# Patient Record
Sex: Male | Born: 1980 | Race: Black or African American | Hispanic: No | Marital: Married | Smoking: Never smoker
Health system: Southern US, Community
[De-identification: ages and names within clinical notes are randomized; demographics above are authoritative.]

## PROBLEM LIST (undated history)

## (undated) DIAGNOSIS — W3400XA Accidental discharge from unspecified firearms or gun, initial encounter: Secondary | ICD-10-CM

## (undated) DIAGNOSIS — K219 Gastro-esophageal reflux disease without esophagitis: Secondary | ICD-10-CM

## (undated) DIAGNOSIS — I10 Essential (primary) hypertension: Secondary | ICD-10-CM

## (undated) HISTORY — PX: LEG SURGERY: SHX1003

---

## 2004-07-08 DIAGNOSIS — W3400XA Accidental discharge from unspecified firearms or gun, initial encounter: Secondary | ICD-10-CM

## 2004-07-08 HISTORY — DX: Accidental discharge from unspecified firearms or gun, initial encounter: W34.00XA

## 2004-10-07 ENCOUNTER — Inpatient Hospital Stay (HOSPITAL_COMMUNITY): Admission: AC | Admit: 2004-10-07 | Discharge: 2004-10-09 | Payer: Self-pay

## 2011-05-19 ENCOUNTER — Encounter (HOSPITAL_COMMUNITY): Payer: Self-pay | Admitting: Anesthesiology

## 2011-05-19 ENCOUNTER — Emergency Department (HOSPITAL_COMMUNITY)

## 2011-05-19 ENCOUNTER — Emergency Department (HOSPITAL_COMMUNITY): Admitting: Anesthesiology

## 2011-05-19 ENCOUNTER — Encounter: Payer: Self-pay | Admitting: *Deleted

## 2011-05-19 ENCOUNTER — Other Ambulatory Visit: Payer: Self-pay

## 2011-05-19 ENCOUNTER — Encounter (HOSPITAL_COMMUNITY): Admission: EM | Disposition: A | Discharge status: Home / Self Care | Payer: Self-pay | Source: Home / Self Care

## 2011-05-19 ENCOUNTER — Inpatient Hospital Stay (HOSPITAL_COMMUNITY)
Admission: EM | Admit: 2011-05-19 | Discharge: 2011-05-22 | DRG: 493 | Disposition: A | Discharge status: Home / Self Care | Attending: General Surgery | Admitting: General Surgery

## 2011-05-19 DIAGNOSIS — F17200 Nicotine dependence, unspecified, uncomplicated: Secondary | ICD-10-CM | POA: Diagnosis present

## 2011-05-19 DIAGNOSIS — W3400XA Accidental discharge from unspecified firearms or gun, initial encounter: Secondary | ICD-10-CM

## 2011-05-19 DIAGNOSIS — S82209B Unspecified fracture of shaft of unspecified tibia, initial encounter for open fracture type I or II: Secondary | ICD-10-CM

## 2011-05-19 DIAGNOSIS — S82409B Unspecified fracture of shaft of unspecified fibula, initial encounter for open fracture type I or II: Secondary | ICD-10-CM

## 2011-05-19 DIAGNOSIS — S63509A Unspecified sprain of unspecified wrist, initial encounter: Secondary | ICD-10-CM | POA: Diagnosis present

## 2011-05-19 DIAGNOSIS — S32409A Unspecified fracture of unspecified acetabulum, initial encounter for closed fracture: Secondary | ICD-10-CM

## 2011-05-19 DIAGNOSIS — S63502A Unspecified sprain of left wrist, initial encounter: Secondary | ICD-10-CM | POA: Diagnosis present

## 2011-05-19 DIAGNOSIS — S82201B Unspecified fracture of shaft of right tibia, initial encounter for open fracture type I or II: Secondary | ICD-10-CM

## 2011-05-19 DIAGNOSIS — D62 Acute posthemorrhagic anemia: Secondary | ICD-10-CM | POA: Diagnosis present

## 2011-05-19 DIAGNOSIS — S82209A Unspecified fracture of shaft of unspecified tibia, initial encounter for closed fracture: Secondary | ICD-10-CM | POA: Diagnosis present

## 2011-05-19 HISTORY — PX: TIBIA IM NAIL INSERTION: SHX2516

## 2011-05-19 HISTORY — DX: Accidental discharge from unspecified firearms or gun, initial encounter: W34.00XA

## 2011-05-19 LAB — URINALYSIS, MICROSCOPIC ONLY
Hgb urine dipstick: NEGATIVE
Ketones, ur: NEGATIVE mg/dL
Leukocytes, UA: NEGATIVE
Specific Gravity, Urine: >1.046 — ABNORMAL HIGH (ref 1.005–1.030)
pH: 6.5 (ref 5.0–8.0)

## 2011-05-19 LAB — ABO/RH: ABO/RH(D): O POS

## 2011-05-19 LAB — COMPREHENSIVE METABOLIC PANEL
ALT: 13 U/L (ref 0–53)
AST: 23 U/L (ref 0–37)
Albumin: 3.7 g/dL (ref 3.5–5.2)
CO2: 24 mEq/L (ref 19–32)
Chloride: 105 mEq/L (ref 96–112)
Creatinine, Ser: 0.9 mg/dL (ref 0.50–1.35)
GFR calc non Af Amer: >90 mL/min (ref >90)
Potassium: 3.7 mEq/L (ref 3.5–5.1)
Sodium: 141 mEq/L (ref 135–145)
Total Bilirubin: 0.3 mg/dL (ref 0.3–1.2)

## 2011-05-19 LAB — CBC
MCH: 30.3 pg (ref 26.0–34.0)
MCV: 88.5 fL (ref 78.0–100.0)
RBC: 4.62 MIL/uL (ref 4.22–5.81)
RDW: 13.2 % (ref 11.5–15.5)
WBC: 22.3 10*3/uL — ABNORMAL HIGH (ref 4.0–10.5)

## 2011-05-19 LAB — TYPE AND SCREEN
ABO/RH(D): O POS
Antibody Screen: NEGATIVE

## 2011-05-19 LAB — SAMPLE TO BLOOD BANK

## 2011-05-19 LAB — PROTIME-INR: INR: 0.98 (ref 0.00–1.49)

## 2011-05-19 SURGERY — INSERTION, INTRAMEDULLARY ROD, TIBIA
Anesthesia: General | Site: Leg Lower | Laterality: Right | Wound class: Contaminated

## 2011-05-19 MED ORDER — FENTANYL CITRATE 0.05 MG/ML IJ SOLN
50.0000 ug | Freq: Once | INTRAMUSCULAR | Status: AC
  Administered 2011-05-19: 50 ug via INTRAVENOUS

## 2011-05-19 MED ORDER — METOCLOPRAMIDE HCL 10 MG PO TABS: 5.0000 mg | ORAL_TABLET | Freq: Three times a day (TID) | ORAL | Status: DC | PRN

## 2011-05-19 MED ORDER — METHOCARBAMOL 500 MG PO TABS
500.0000 mg | ORAL_TABLET | Freq: Four times a day (QID) | ORAL | Status: DC | PRN
  Administered 2011-05-19 – 2011-05-21 (×4): 500 mg via ORAL
  Filled 2011-05-19 (×4): qty 1

## 2011-05-19 MED ORDER — PANTOPRAZOLE SODIUM 40 MG IV SOLR
40.0000 mg | Freq: Every day | INTRAVENOUS | Status: DC
  Filled 2011-05-19 (×3): qty 40

## 2011-05-19 MED ORDER — PROMETHAZINE HCL 25 MG/ML IJ SOLN: 6.2500 mg | INTRAMUSCULAR | Status: DC | PRN

## 2011-05-19 MED ORDER — VECURONIUM BROMIDE 10 MG IV SOLR
INTRAVENOUS | Status: DC | PRN
  Administered 2011-05-19: 3 mg via INTRAVENOUS

## 2011-05-19 MED ORDER — HETASTARCH-ELECTROLYTES 6 % IV SOLN
INTRAVENOUS | Status: DC | PRN
  Administered 2011-05-19: 12:00:00 via INTRAVENOUS

## 2011-05-19 MED ORDER — SENNOSIDES-DOCUSATE SODIUM 8.6-50 MG PO TABS: 1.0000 | ORAL_TABLET | Freq: Every evening | ORAL | Status: DC | PRN

## 2011-05-19 MED ORDER — POTASSIUM CHLORIDE 2 MEQ/ML IV SOLN
INTRAVENOUS | Status: DC
  Administered 2011-05-19 – 2011-05-20 (×3): via INTRAVENOUS
  Filled 2011-05-19 (×3): qty 1000

## 2011-05-19 MED ORDER — POTASSIUM CHLORIDE IN NACL 40-0.9 MEQ/L-% IV SOLN
INTRAVENOUS | Status: DC
  Administered 2011-05-20: 06:00:00 via INTRAVENOUS
  Filled 2011-05-19: qty 1000

## 2011-05-19 MED ORDER — LACTATED RINGERS IV SOLN
INTRAVENOUS | Status: DC | PRN
  Administered 2011-05-19 (×3): via INTRAVENOUS

## 2011-05-19 MED ORDER — HYDROMORPHONE HCL PF 1 MG/ML IJ SOLN: 1.0000 mg | INTRAMUSCULAR | Status: DC | PRN

## 2011-05-19 MED ORDER — HYDROMORPHONE HCL PF 1 MG/ML IJ SOLN
INTRAMUSCULAR | Status: AC
  Filled 2011-05-19: qty 1

## 2011-05-19 MED ORDER — HYDROMORPHONE HCL PF 1 MG/ML IJ SOLN: 0.2500 mg | INTRAMUSCULAR | Status: DC | PRN

## 2011-05-19 MED ORDER — ROCURONIUM BROMIDE 100 MG/10ML IV SOLN
INTRAVENOUS | Status: DC | PRN
  Administered 2011-05-19: 30 mg via INTRAVENOUS
  Administered 2011-05-19: 20 mg via INTRAVENOUS

## 2011-05-19 MED ORDER — DOCUSATE SODIUM 100 MG PO CAPS
100.0000 mg | ORAL_CAPSULE | Freq: Two times a day (BID) | ORAL | Status: DC
  Administered 2011-05-19 – 2011-05-22 (×6): 100 mg via ORAL
  Filled 2011-05-19 (×9): qty 1

## 2011-05-19 MED ORDER — MIDAZOLAM HCL 2 MG/2ML IJ SOLN: 0.5000 mg | Freq: Once | INTRAMUSCULAR | Status: DC | PRN

## 2011-05-19 MED ORDER — FENTANYL CITRATE 0.05 MG/ML IJ SOLN
INTRAMUSCULAR | Status: AC
  Administered 2011-05-19: 25 ug
  Filled 2011-05-19: qty 2

## 2011-05-19 MED ORDER — MORPHINE SULFATE 2 MG/ML IJ SOLN: 2.0000 mg | INTRAMUSCULAR | Status: DC | PRN

## 2011-05-19 MED ORDER — BISACODYL 10 MG RE SUPP: 10.0000 mg | Freq: Every day | RECTAL | Status: DC | PRN

## 2011-05-19 MED ORDER — FENTANYL CITRATE 0.05 MG/ML IJ SOLN
INTRAMUSCULAR | Status: DC | PRN
  Administered 2011-05-19: 50 ug via INTRAVENOUS
  Administered 2011-05-19: 150 ug via INTRAVENOUS
  Administered 2011-05-19: 100 ug via INTRAVENOUS

## 2011-05-19 MED ORDER — PROPOFOL 10 MG/ML IV EMUL
INTRAVENOUS | Status: DC | PRN
  Administered 2011-05-19: 170 mg via INTRAVENOUS

## 2011-05-19 MED ORDER — VANCOMYCIN HCL IN DEXTROSE 1-5 GM/200ML-% IV SOLN
1000.0000 mg | Freq: Two times a day (BID) | INTRAVENOUS | Status: AC
  Administered 2011-05-19 – 2011-05-20 (×3): 1000 mg via INTRAVENOUS
  Filled 2011-05-19 (×3): qty 200

## 2011-05-19 MED ORDER — ONDANSETRON HCL 4 MG PO TABS: 4.0000 mg | ORAL_TABLET | Freq: Four times a day (QID) | ORAL | Status: DC | PRN

## 2011-05-19 MED ORDER — LACTATED RINGERS IV SOLN: INTRAVENOUS | Status: DC

## 2011-05-19 MED ORDER — SODIUM CHLORIDE 0.9 % IR SOLN
Status: DC | PRN
  Administered 2011-05-19: 1000 mL

## 2011-05-19 MED ORDER — MEPERIDINE HCL 25 MG/ML IJ SOLN: 6.2500 mg | INTRAMUSCULAR | Status: DC | PRN

## 2011-05-19 MED ORDER — PHENYLEPHRINE HCL 10 MG/ML IJ SOLN
INTRAMUSCULAR | Status: DC | PRN
  Administered 2011-05-19: 120 ug via INTRAVENOUS

## 2011-05-19 MED ORDER — MORPHINE SULFATE 2 MG/ML IJ SOLN: 0.0500 mg/kg | INTRAMUSCULAR | Status: DC | PRN

## 2011-05-19 MED ORDER — METOCLOPRAMIDE HCL 5 MG/ML IJ SOLN
5.0000 mg | Freq: Three times a day (TID) | INTRAMUSCULAR | Status: DC | PRN
  Filled 2011-05-19: qty 2

## 2011-05-19 MED ORDER — ONDANSETRON HCL 4 MG/2ML IJ SOLN
INTRAMUSCULAR | Status: DC | PRN
  Administered 2011-05-19: 4 mg via INTRAVENOUS

## 2011-05-19 MED ORDER — WARFARIN SODIUM 7.5 MG PO TABS
7.5000 mg | ORAL_TABLET | Freq: Once | ORAL | Status: AC
  Administered 2011-05-19: 7.5 mg via ORAL
  Filled 2011-05-19: qty 1

## 2011-05-19 MED ORDER — IOHEXOL 350 MG/ML SOLN: 100.0000 mL | Freq: Once | INTRAVENOUS | Status: AC | PRN

## 2011-05-19 MED ORDER — NEOSTIGMINE METHYLSULFATE 1 MG/ML IJ SOLN
INTRAMUSCULAR | Status: DC | PRN
  Administered 2011-05-19: 5 mg via INTRAVENOUS

## 2011-05-19 MED ORDER — OXYCODONE-ACETAMINOPHEN 5-325 MG PO TABS
1.0000 | ORAL_TABLET | ORAL | Status: DC | PRN
Start: 2011-05-19 — End: 2011-05-21
  Administered 2011-05-19 – 2011-05-21 (×7): 2 via ORAL
  Filled 2011-05-19 (×7): qty 2

## 2011-05-19 MED ORDER — ALBUMIN HUMAN 5 % IV SOLN
INTRAVENOUS | Status: DC | PRN
  Administered 2011-05-19: 11:00:00 via INTRAVENOUS

## 2011-05-19 MED ORDER — COUMADIN BOOK
Freq: Once | Status: AC
  Administered 2011-05-19: 20:00:00
  Filled 2011-05-19: qty 1

## 2011-05-19 MED ORDER — HYDROMORPHONE HCL PF 1 MG/ML IJ SOLN: 0.5000 mg | INTRAMUSCULAR | Status: DC | PRN

## 2011-05-19 MED ORDER — METHOCARBAMOL 100 MG/ML IJ SOLN
500.0000 mg | Freq: Four times a day (QID) | INTRAMUSCULAR | Status: DC | PRN
  Filled 2011-05-19: qty 5

## 2011-05-19 MED ORDER — ONDANSETRON HCL 4 MG/2ML IJ SOLN: 4.0000 mg | Freq: Four times a day (QID) | INTRAMUSCULAR | Status: DC | PRN

## 2011-05-19 MED ORDER — POTASSIUM CHLORIDE IN NACL 20-0.45 MEQ/L-% IV SOLN: INTRAVENOUS | Status: DC

## 2011-05-19 MED ORDER — GLYCOPYRROLATE 0.2 MG/ML IJ SOLN
INTRAMUSCULAR | Status: DC | PRN
  Administered 2011-05-19: .8 mg via INTRAVENOUS

## 2011-05-19 MED ORDER — MORPHINE SULFATE 4 MG/ML IJ SOLN
1.0000 mg | INTRAMUSCULAR | Status: DC | PRN
  Administered 2011-05-19 – 2011-05-21 (×7): 2 mg via INTRAVENOUS
  Filled 2011-05-19 (×7): qty 1

## 2011-05-19 MED ORDER — PANTOPRAZOLE SODIUM 40 MG PO TBEC
40.0000 mg | DELAYED_RELEASE_TABLET | Freq: Every day | ORAL | Status: DC
  Administered 2011-05-19 – 2011-05-20 (×2): 40 mg via ORAL
  Filled 2011-05-19 (×2): qty 1

## 2011-05-19 MED ORDER — WARFARIN VIDEO
Freq: Once | Status: AC
  Administered 2011-05-20: 20:00:00
  Filled 2011-05-19: qty 1

## 2011-05-19 MED ORDER — MIDAZOLAM HCL 5 MG/5ML IJ SOLN
INTRAMUSCULAR | Status: DC | PRN
  Administered 2011-05-19: 2 mg via INTRAVENOUS

## 2011-05-19 MED ORDER — VANCOMYCIN HCL IN DEXTROSE 1-5 GM/200ML-% IV SOLN
1000.0000 mg | Freq: Once | INTRAVENOUS | Status: AC
  Administered 2011-05-19: 1000 mg via INTRAVENOUS
  Filled 2011-05-19: qty 200

## 2011-05-19 MED ORDER — SUCCINYLCHOLINE CHLORIDE 20 MG/ML IJ SOLN
INTRAMUSCULAR | Status: DC | PRN
  Administered 2011-05-19: 100 mg via INTRAVENOUS

## 2011-05-19 SURGICAL SUPPLY — 66 items
10mm x 40cm trigen meta nail tibial ×1 IMPLANT
BANDAGE ELASTIC 4 VELCRO ST LF (GAUZE/BANDAGES/DRESSINGS) ×2 IMPLANT
BANDAGE ELASTIC 6 VELCRO ST LF (GAUZE/BANDAGES/DRESSINGS) ×2 IMPLANT
BANDAGE ESMARK 6X9 LF (GAUZE/BANDAGES/DRESSINGS) IMPLANT
BANDAGE GAUZE ELAST BULKY 4 IN (GAUZE/BANDAGES/DRESSINGS) ×2 IMPLANT
BIT DRILL LONG 4.0 (BIT) IMPLANT
BIT DRILL SHORT 4.0 (BIT) IMPLANT
BLADE SURG 15 STRL LF DISP TIS (BLADE) ×1 IMPLANT
BLADE SURG 15 STRL SS (BLADE)
BLADE SURG ROTATE 9660 (MISCELLANEOUS) ×1 IMPLANT
BNDG CMPR 9X6 STRL LF SNTH (GAUZE/BANDAGES/DRESSINGS)
BNDG COHESIVE 6X5 TAN STRL LF (GAUZE/BANDAGES/DRESSINGS) ×2 IMPLANT
BNDG ESMARK 6X9 LF (GAUZE/BANDAGES/DRESSINGS)
CLOTH BEACON ORANGE TIMEOUT ST (SAFETY) ×2 IMPLANT
COVER SURGICAL LIGHT HANDLE (MISCELLANEOUS) ×4 IMPLANT
CUFF TOURNIQUET SINGLE 34IN LL (TOURNIQUET CUFF) IMPLANT
CUFF TOURNIQUET SINGLE 44IN (TOURNIQUET CUFF) IMPLANT
DRAPE C-ARM 42X72 X-RAY (DRAPES) ×2 IMPLANT
DRAPE ORTHO SPLIT 77X108 STRL (DRAPES) ×4
DRAPE PROXIMA HALF (DRAPES) ×7 IMPLANT
DRAPE SURG ORHT 6 SPLT 77X108 (DRAPES) ×2 IMPLANT
DRAPE U-SHAPE 47X51 STRL (DRAPES) ×2 IMPLANT
DRILL BIT LONG 4.0 (BIT) ×2
DRILL BIT SHORT 4.0 (BIT) ×2
DRSG VAC ATS SM SENSATRAC (GAUZE/BANDAGES/DRESSINGS) ×1 IMPLANT
DURAPREP 26ML APPLICATOR (WOUND CARE) ×3 IMPLANT
ELECT REM PT RETURN 9FT ADLT (ELECTROSURGICAL) ×2
ELECTRODE REM PT RTRN 9FT ADLT (ELECTROSURGICAL) ×1 IMPLANT
FACESHIELD LNG OPTICON STERILE (SAFETY) IMPLANT
GAUZE XEROFORM 5X9 LF (GAUZE/BANDAGES/DRESSINGS) ×2 IMPLANT
GLOVE BIOGEL PI IND STRL 8 (GLOVE) ×1 IMPLANT
GLOVE BIOGEL PI INDICATOR 8 (GLOVE) ×1
GLOVE SURG ORTHO 8.0 STRL STRW (GLOVE) ×4 IMPLANT
GOWN PREVENTION PLUS LG XLONG (DISPOSABLE) IMPLANT
GOWN PREVENTION PLUS XLARGE (GOWN DISPOSABLE) ×2 IMPLANT
GOWN STRL NON-REIN LRG LVL3 (GOWN DISPOSABLE) ×4 IMPLANT
GUIDE PIN 3.2MM (MISCELLANEOUS) ×2
GUIDE PIN ORTH 343X3.2XBRAD (MISCELLANEOUS) IMPLANT
GUIDE ROD 3.0 (MISCELLANEOUS) ×2
KIT BASIN OR (CUSTOM PROCEDURE TRAY) ×2 IMPLANT
KIT INFUSE MEDIUM (Orthopedic Implant) ×1 IMPLANT
KIT ROOM TURNOVER OR (KITS) ×2 IMPLANT
MANIFOLD NEPTUNE II (INSTRUMENTS) ×2 IMPLANT
NS IRRIG 1000ML POUR BTL (IV SOLUTION) ×3 IMPLANT
PACK GENERAL/GYN (CUSTOM PROCEDURE TRAY) ×2 IMPLANT
PAD NEG PRESSURE SENSATRAC (MISCELLANEOUS) ×1 IMPLANT
PADDING WEBRIL 4 STERILE (GAUZE/BANDAGES/DRESSINGS) ×1 IMPLANT
ROD GUIDE 3.0 (MISCELLANEOUS) IMPLANT
SCREW TRIGEN LOW PROF 5.0X30 (Screw) ×1 IMPLANT
SCREW TRIGEN LOW PROF 5.0X35 (Screw) ×1 IMPLANT
SCREW TRIGEN LOW PROF 5.0X45 (Screw) ×1 IMPLANT
SCREW TRIGEN LOW PROF 5.0X50 (Screw) ×1 IMPLANT
SPLINT PLASTER 5X30 (CAST SUPPLIES) ×1 IMPLANT
SPONGE GAUZE 4X4 12PLY (GAUZE/BANDAGES/DRESSINGS) ×4 IMPLANT
STAPLER VISISTAT 35W (STAPLE) ×2 IMPLANT
STOCKINETTE IMPERVIOUS LG (DRAPES) ×2 IMPLANT
SUT ETHILON 2 0 FS 18 (SUTURE) ×3 IMPLANT
SUT ETHILON 3 0 PS 1 (SUTURE) ×2 IMPLANT
SUT VIC AB 0 CT1 27 (SUTURE) ×2
SUT VIC AB 0 CT1 27XBRD ANBCTR (SUTURE) ×1 IMPLANT
SUT VIC AB 2-0 CT1 27 (SUTURE) ×2
SUT VIC AB 2-0 CT1 TAPERPNT 27 (SUTURE) ×1 IMPLANT
TOWEL OR 17X24 6PK STRL BLUE (TOWEL DISPOSABLE) ×2 IMPLANT
TOWEL OR 17X26 10 PK STRL BLUE (TOWEL DISPOSABLE) ×2 IMPLANT
TRAY FOLEY CATH 14FR (SET/KITS/TRAYS/PACK) ×1 IMPLANT
WATER STERILE IRR 1000ML POUR (IV SOLUTION) ×2 IMPLANT
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
## 2011-05-19 NOTE — Preoperative (Addendum)
Beta Blockers   Reason not to administer Beta Blockers:Not Applicable 

## 2011-05-19 NOTE — Anesthesia Procedure Notes (Signed)
Procedure Name: Intubation Date/Time: 05/19/2011 10:56 AM Performed by: Glendora Score Pre-anesthesia Checklist: Patient identified, Emergency Drugs available, Suction available and Patient being monitored Patient Re-evaluated:Patient Re-evaluated prior to inductionOxygen Delivery Method: Circle System Utilized Preoxygenation: Pre-oxygenation with 100% oxygen Intubation Type: IV induction, Rapid sequence and Circoid Pressure applied Laryngoscope Size: Miller and 2 Grade View: Grade I Tube type: Oral Tube size: 7.5 mm Number of attempts: 1 Airway Equipment and Method: stylet Placement Confirmation: ETT inserted through vocal cords under direct vision,  positive ETCO2 and breath sounds checked- equal and bilateral Secured at: 23 cm Tube secured with: Tape Dental Injury: Teeth and Oropharynx as per pre-operative assessment
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
## 2011-05-20 LAB — BASIC METABOLIC PANEL
Chloride: 103 mEq/L (ref 96–112)
GFR calc Af Amer: >90 mL/min (ref >90)
GFR calc non Af Amer: >90 mL/min (ref >90)
Potassium: 4 mEq/L (ref 3.5–5.1)
Sodium: 134 mEq/L — ABNORMAL LOW (ref 135–145)

## 2011-05-20 LAB — DIFFERENTIAL
Basophils Absolute: 0 10*3/uL (ref 0.0–0.1)
Basophils Relative: 0 % (ref 0–1)
Eosinophils Absolute: 0.1 10*3/uL (ref 0.0–0.7)
Monocytes Absolute: 1.4 10*3/uL — ABNORMAL HIGH (ref 0.1–1.0)
Neutro Abs: 7.1 10*3/uL (ref 1.7–7.7)
Neutrophils Relative %: 69 % (ref 43–77)

## 2011-05-20 LAB — CBC
Platelets: 194 10*3/uL (ref 150–400)
RDW: 13.1 % (ref 11.5–15.5)
WBC: 10.3 10*3/uL (ref 4.0–10.5)

## 2011-05-20 LAB — PROTIME-INR: Prothrombin Time: 15.1 seconds (ref 11.6–15.2)

## 2011-05-20 MED ORDER — WARFARIN SODIUM 10 MG PO TABS
10.0000 mg | ORAL_TABLET | Freq: Once | ORAL | Status: AC
  Administered 2011-05-20: 10 mg via ORAL
  Filled 2011-05-20: qty 1

## 2011-05-20 NOTE — Progress Notes (Signed)
ANTICOAGULATION CONSULT NOTE - Follow Up Consult  Pharmacy Consult for Coumadin for VTE prophylaxis Indication: VTE prophylaxis  Allergies  Allergen Reactions  . Penicillins Anaphylaxis    Patient Measurements:  Patient states that he weighs 170 pounds and is 6'1" tall.   TBW = 77 kg and IBW = 80 kg. Adjusted Body Weight: 80kg  Vital Signs: Temp: 99.4 F (37.4 C) (11/12 0701) Temp src: Oral (11/12 0701) BP: 119/59 mmHg (11/12 0701) Pulse Rate: 79  (11/12 0701)  Labs:  Basename 05/20/11 0550 05/19/11 0448  HGB 10.2* 14.0  HCT 30.1* 40.9  PLT 194 262  APTT -- --  LABPROT 15.1 13.2  INR 1.17 0.98  HEPARINUNFRC -- --  CREATININE 0.88 0.90  CKTOTAL -- --  CKMB -- --  TROPONINI -- --   CrCl is unknown because there is no height on file for the current visit.   Medications:  Scheduled:    . coumadin book   Does not apply Once  . docusate sodium  100 mg Oral BID  . pantoprazole  40 mg Oral Q1200   Or  . pantoprazole (PROTONIX) IV  40 mg Intravenous Q1200  . vancomycin  1,000 mg Intravenous Q12H  . warfarin  7.5 mg Oral Once  . warfarin   Does not apply Once    Assessment: Patient INR is 1.17 today after a coumadin dose of 7.5mg  yesterday.  Platelets and hct are dropping also.  Patient is on vancomycin and asking for stronger pain meds.  Goal of Therapy:  INR 2-3   Plan:  Coumadin 10mg  (based upon Nomogram score of 11) x 1 today at 1800.  Recheck INR in am and adjust dosage accordingly.   Walden Field B 05/20/2011,1:29 PM

## 2011-05-20 NOTE — Progress Notes (Signed)
Subjective: My leg hurts - pt on pain meds which are helping   Objective: Vital signs in last 24 hours: Temp:  [98.1 F (36.7 C)-99.4 F (37.4 C)] 99.4 F (37.4 C) (11/12 0701) Pulse Rate:  [68-117] 79  (11/12 0701) Resp:  [11-25] 20  (11/11 1618) BP: (119-161)/(59-112) 119/59 mmHg (11/12 0701) SpO2:  [100 %] 100 % (11/12 0701)  Intake/Output from previous day: 11/11 0701 - 11/12 0700 In: 3400 [I.V.:2650; IV Piggyback:750] Out: 200 [Urine:150; Blood:50] Intake/Output this shift: Total I/O In: 240 [P.O.:240] Out: 800 [Urine:800]  Exam:   toe pf ok on r - continued absent df - foot perfused - l hand mobility no different  Labs:  [facility] 05/20/11 0550 05/19/11 0448  HGB 10.2* 14.0    Basename 05/20/11 0550 05/19/11 0448  WBC 10.3 22.3*  RBC 3.37* 4.62  HCT 30.1* 40.9  PLT 194 262    Basename 05/20/11 0550 05/19/11 0448  NA 134* 141  K 4.0 3.7  CL 103 105  CO2 24 24  BUN 9 11  CREATININE 0.88 0.90  GLUCOSE 113* 146*  CALCIUM 8.5 8.9    Basename 05/20/11 0550 05/19/11 0448  LABPT -- --  INR 1.17 0.98    Assessment/Plan: Mobilize with pt - d/c wound vac wed - tdwb for transfers rle knee rom as tol -begin coumadin tonight for dvt prophylaxis compts soft today   Byrdie Miyazaki SCOTT 05/20/2011, 11:49 AM
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
## 2011-05-21 DIAGNOSIS — Z7289 Other problems related to lifestyle: Secondary | ICD-10-CM | POA: Insufficient documentation

## 2011-05-21 DIAGNOSIS — Z720 Tobacco use: Secondary | ICD-10-CM | POA: Insufficient documentation

## 2011-05-21 LAB — BASIC METABOLIC PANEL
BUN: 7 mg/dL (ref 6–23)
CO2: 27 mEq/L (ref 19–32)
Calcium: 8.9 mg/dL (ref 8.4–10.5)
Chloride: 98 mEq/L (ref 96–112)
Creatinine, Ser: 0.93 mg/dL (ref 0.50–1.35)

## 2011-05-21 LAB — PROTIME-INR: Prothrombin Time: 20.8 seconds — ABNORMAL HIGH (ref 11.6–15.2)

## 2011-05-21 MED ORDER — WARFARIN SODIUM 7.5 MG PO TABS
7.5000 mg | ORAL_TABLET | Freq: Once | ORAL | Status: AC
  Administered 2011-05-21: 7.5 mg via ORAL
  Filled 2011-05-21: qty 1

## 2011-05-21 MED ORDER — MORPHINE SULFATE 4 MG/ML IJ SOLN
2.0000 mg | INTRAMUSCULAR | Status: DC | PRN
  Filled 2011-05-21: qty 1

## 2011-05-21 MED ORDER — TRAMADOL HCL 50 MG PO TABS
100.0000 mg | ORAL_TABLET | Freq: Four times a day (QID) | ORAL | Status: DC
  Administered 2011-05-21 – 2011-05-22 (×5): 100 mg via ORAL
  Filled 2011-05-21: qty 2
  Filled 2011-05-21: qty 1
  Filled 2011-05-21 (×2): qty 2
  Filled 2011-05-21: qty 1
  Filled 2011-05-21: qty 2

## 2011-05-21 MED ORDER — OXYCODONE HCL 5 MG PO TABS
10.0000 mg | ORAL_TABLET | ORAL | Status: DC | PRN
  Administered 2011-05-21 – 2011-05-22 (×3): 10 mg via ORAL
  Filled 2011-05-21 (×3): qty 2

## 2011-05-21 NOTE — Progress Notes (Addendum)
ANTICOAGULATION CONSULT NOTE - Follow Up Consult  Pharmacy Consult for coumadin Indication: VTE prophylaxis  Allergies  Allergen Reactions  . Penicillins Anaphylaxis    Vital Signs: Temp: 99.1 F (37.3 C) (11/13 0606) BP: 124/75 mmHg (11/13 0606) Pulse Rate: 89  (11/13 0606)  Labs:  Basename 05/21/11 0720 05/20/11 0550 05/19/11 0448  HGB -- 10.2* 14.0  HCT -- 30.1* 40.9  PLT -- 194 262  APTT -- -- --  LABPROT 20.8* 15.1 13.2  INR 1.76* 1.17 0.98  HEPARINUNFRC -- -- --  CREATININE 0.93 0.88 0.90  CKTOTAL -- -- --  CKMB -- -- --  TROPONINI -- -- --   CrCl is unknown because there is no height on file for the current visit.   Medications:  No prescriptions prior to admission   Scheduled:    . docusate sodium  100 mg Oral BID  . traMADol  100 mg Oral Q6H  . vancomycin  1,000 mg Intravenous Q12H  . warfarin  10 mg Oral ONCE-1800  . warfarin   Does not apply Once  . DISCONTD: pantoprazole  40 mg Oral Q1200  . DISCONTD: pantoprazole (PROTONIX) IV  40 mg Intravenous Q1200    Assessment: INR 1.76.  Dramatic response and will reduce dosage of warfarin.  Lovenox still needs to be continued for at least 5 day overlap with coumadin.  Pain an issue still. Began teaching about warfarin and would like to provide information to patient's grandmother of this important therapy.   Goal of Therapy:  INR 2-3   Plan:  Continue lovenox at current dose.  Reduce warfarin dose to 7.5mg  since the INR rose so robustly from yesterday.  Today's dose will show up on the INR in 2 days and we may exceed our goal.  Therefore, will reduce dosage to 7.5mg .  Will educate again today. Evaluate again tomorrow.  Walden Field B 05/21/2011,2:03 PM   P ANTICOAGULATION CONSULT NOTE - Follow Up Consult  Pharmacy Consult for coumadin Indication: VTE prophylaxis  Allergies  Allergen Reactions  . Penicillins Anaphylaxis    Vital Signs: Temp: 99 F (37.2 C) (11/13 1408) Temp src: Oral  (11/13 1408) BP: 125/70 mmHg (11/13 1408) Pulse Rate: 69  (11/13 1408)  Labs:  Basename 05/21/11 0720 05/20/11 0550 05/19/11 0448  HGB -- 10.2* 14.0  HCT -- 30.1* 40.9  PLT -- 194 262  APTT -- -- --  LABPROT 20.8* 15.1 13.2  INR 1.76* 1.17 0.98  HEPARINUNFRC -- -- --  CREATININE 0.93 0.88 0.90  CKTOTAL -- -- --  CKMB -- -- --  TROPONINI -- -- --   CrCl is unknown because there is no height on file for the current visit.  Medication Correction.  Patient is not on lovenox.}  Assessment: Continue warfarin 7.5mg     Plan:  Left coumadin information for patient and grandmother to read.  Patient can restate the key points about coumadin.  Walden Field B 05/21/2011,2:39 PM
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
## 2011-05-21 NOTE — Progress Notes (Signed)
Patient examined and I agree with the assessment and plan  Joas Motton E  

## 2011-05-21 NOTE — Progress Notes (Signed)
Physical Therapy Treatment Patient Details Name: Jonathan Freeman MRN: [id number] DOB: 04-27-1981 Today's Date: 05/21/2011  PT Assessment/Plan  PT - Assessment/Plan Comments on Treatment Session: Pt. ambulated today. Will attempt long ambulation next session if patient able to tolerate PT Plan: Discharge plan remains appropriate PT Frequency: Min 6X/week Follow Up Recommendations: Home health PT Equipment Recommended: Wheelchair cushion (measurements);3 in 1 bedside comode;Rolling walker with 5" wheels;Other (comment) (w/c with elevating legrest) PT Goals  Acute Rehab PT Goals PT Transfer Goal: Sit to Stand/Stand to Sit - Progress: Progressing toward goal PT Transfer Goal: Bed to Chair/Chair to Bed - Progress: Progressing toward goal PT Goal: Ambulate - Progress: Progressing toward goal  PT Treatment Precautions/Restrictions  Precautions Precautions: Other (comment) Precaution Comments: wound vac LLE Required Braces or Orthoses: No Restrictions Weight Bearing Restrictions: Yes RLE Weight Bearing: Non weight bearing Other Position/Activity Restrictions: Can TDWB for transfers on LLE Mobility (including Balance) Bed Mobility Bed Mobility: No Transfers Transfers: Yes Sit to Stand: 3: Mod assist;From chair/3-in-1;With upper extremity assist;With armrests Sit to Stand Details (indicate cue type and reason): cues for safe hand placement and positioning. A to initiate stand and ensure TDWB with transfer Stand to Sit: 3: Mod assist;To chair/3-in-1;Without upper extremity assist;With armrests Stand to Sit Details: Max cues to control descent into chair. Cues for safe technique Ambulation/Gait Ambulation/Gait: Yes Ambulation/Gait Assistance: 4: Min assist Ambulation/Gait Assistance Details (indicate cue type and reason): A for balance and use of RW. Pt has the tendency to want to lean on L side of RW with L elbow as he cannot tolerate weight through L wrist. Recommend PFRW on L next  session.  Ambulation Distance (Feet): 20 Feet Assistive device: Rolling walker Gait Pattern: Trunk flexed;Lateral trunk lean to left (hop to gait pattern) Stairs: No Wheelchair Mobility Wheelchair Mobility: No    Exercise    End of Session PT - End of Session Equipment Utilized During Treatment: Gait belt Activity Tolerance: Patient limited by pain Patient left: in chair;with call bell in reach General Behavior During Session: [facility] for tasks performed Cognition: [facility] for tasks performed  Cicely Ortner, Adline Potter 05/21/2011, 1:12 PM 05/21/2011 Fredrich Birks PTA [phone] pager [phone] office
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
## 2011-05-22 LAB — BASIC METABOLIC PANEL
BUN: 8 mg/dL (ref 6–23)
Chloride: 94 mEq/L — ABNORMAL LOW (ref 96–112)
Glucose, Bld: 134 mg/dL — ABNORMAL HIGH (ref 70–99)
Potassium: 3.7 mEq/L (ref 3.5–5.1)

## 2011-05-22 MED ORDER — TRAMADOL HCL 50 MG PO TABS: 100.0000 mg | ORAL_TABLET | Freq: Four times a day (QID) | ORAL | Status: DC

## 2011-05-22 MED ORDER — OXYCODONE HCL 10 MG PO TABS: 10.0000 mg | ORAL_TABLET | ORAL | Status: AC | PRN

## 2011-05-22 MED ORDER — SENNOSIDES-DOCUSATE SODIUM 8.6-50 MG PO TABS: 1.0000 | ORAL_TABLET | Freq: Every evening | ORAL | Status: AC | PRN

## 2011-05-22 MED ORDER — TRAMADOL HCL 50 MG PO TABS: 100.0000 mg | ORAL_TABLET | Freq: Four times a day (QID) | ORAL | Status: AC

## 2011-05-22 MED ORDER — DSS 100 MG PO CAPS: 100.0000 mg | ORAL_CAPSULE | Freq: Two times a day (BID) | ORAL | Status: AC

## 2011-05-22 MED ORDER — WARFARIN SODIUM 2.5 MG PO TABS: 2.5000 mg | ORAL_TABLET | Freq: Every day | ORAL | Status: DC

## 2011-05-22 NOTE — Progress Notes (Signed)
Subjective: My leg hurts but gradually getting better   Objective: Vital signs in last 24 hours: Temp:  [99 F (37.2 C)-100.2 F (37.9 C)] 99.1 F (37.3 C) (11/14 0609) Pulse Rate:  [69-82] 82  (11/14 0609) Resp:  [19-20] 19  (11/14 0609) BP: (116-125)/(59-70) 124/67 mmHg (11/14 0609) SpO2:  [94 %-98 %] 98 % (11/14 0609) FiO2 (%):  [2 %] 2 % (11/13 2231)  Intake/Output from previous day: 11/13 0701 - 11/14 0700 In: 760 [P.O.:600; I.V.:160] Out: 400 [Urine:400] Intake/Output this shift:    Exam:  dressing removed = incisions ok - vac off - compt soft - no df  Labs:  [facility] 05/20/11 0550  HGB 10.2*    Basename 05/20/11 0550  WBC 10.3  RBC 3.37*  HCT 30.1*  PLT 194    Basename 05/21/11 0720 05/20/11 0550  NA 133* 134*  K 4.0 4.0  CL 98 103  CO2 27 24  BUN 7 9  CREATININE 0.93 0.88  GLUCOSE 122* 113*  CALCIUM 8.9 8.5    Basename 05/22/11 0622 05/21/11 0720  LABPT -- --  INR 2.12* 1.76*    Assessment/Plan: Ok for dc - change to fx boot - fu 7 days - tdwbat with walker - keep incisions dry   DEAN,GREGORY SCOTT 05/22/2011, 7:23 AM
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
## 2011-05-22 NOTE — Progress Notes (Signed)
Physical Therapy Treatment Patient Details Name: Jonathan Freeman MRN: [id number] DOB: Jan 17, 1981 Today's Date: 05/22/2011  PT Assessment/Plan  PT - Assessment/Plan Comments on Treatment Session: Pt self limiting due to pain. Pt declined further ambulation after stairs. Pt may DC today mper MD notes. Pt has family support at home. I believe patient will manage well at home with his DME PT Plan: Discharge plan remains appropriate PT Frequency: Min 6X/week Follow Up Recommendations: Home health PT Equipment Recommended: Wheelchair cushion (measurements);Other (comment);3 in 1 bedside comode;Rolling walker with 5" wheels (Elevating leg rest; Left platform RW ) PT Goals  Acute Rehab PT Goals PT Transfer Goal: Sit to Stand/Stand to Sit - Progress: Progressing toward goal PT Transfer Goal: Bed to Chair/Chair to Bed - Progress: Progressing toward goal PT Goal: Ambulate - Progress: Progressing toward goal PT Goal: Up/Down Stairs - Progress: Progressing toward goal  PT Treatment Precautions/Restrictions  Precautions Precautions: Other (comment) Precaution Comments: wound vac LLE Required Braces or Orthoses: No Restrictions Weight Bearing Restrictions: Yes RLE Weight Bearing: Non weight bearing Other Position/Activity Restrictions: Can TDWB for transfers Mobility (including Balance) Transfers Transfers: Yes Sit to Stand: 4: Min assist;From chair/3-in-1 Sit to Stand Details (indicate cue type and reason): Performed x2. Cues for safe hand placement and technique. A for balance Stand to Sit: 4: Min assist;To chair/3-in-1 Stand to Sit Details: A to control descent into chair.  Ambulation/Gait Ambulation/Gait: Yes Ambulation/Gait Assistance: 4: Min assist Ambulation/Gait Assistance Details (indicate cue type and reason): A for balance and RW. Cues for safe technique using RW. Cues for posture Ambulation Distance (Feet): 15 Feet Assistive device: Left platform walker Gait Pattern: Step-to  pattern;Trunk flexed;Lateral trunk lean to left Stairs: Yes Stairs Assistance: 3: Mod assist Stairs Assistance Details (indicate cue type and reason): A for balance and positioning of RW. Cues for technique. Pt has a WC that he will be going home with. Gave patient handout of WC with stairs.  Stair Management Technique: Backwards;With walker Number of Stairs: 3  Wheelchair Mobility Wheelchair Mobility: No    Exercise    End of Session PT - End of Session Equipment Utilized During Treatment: Gait belt;Other (comment) (R cam boot) Activity Tolerance: Patient limited by pain Patient left: in chair;with call bell in reach General Behavior During Session: [facility] for tasks performed Cognition: [facility] for tasks performed  Fredrich Birks 05/22/2011, 10:39 AM 05/22/2011 Fredrich Birks PTA [phone] pager [phone] office
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
## 2011-05-22 NOTE — Progress Notes (Signed)
Pt being discharged to grandmother's care:  Address is:  [address]   [place], [place] [address]                                                                               Phone #:    [phone]

## 2011-05-22 NOTE — Discharge Summary (Signed)
Okay to go home.  This patient has been seen and I agree with the findings and treatment plan.  Donnivan Villena O. Coriana Angello, III, MD, FACS [phone] (pager) [phone] (direct pager) Trauma Surgeon 

## 2011-05-23 NOTE — Progress Notes (Signed)
This patient has been seen and I agree with the findings and treatment plan.  Tehillah Cipriani O. Hilberto Burzynski, III, MD, FACS [phone] (pager) [phone] (direct pager) Trauma Surgeon  

## 2011-05-28 ENCOUNTER — Encounter (HOSPITAL_COMMUNITY): Payer: Self-pay | Admitting: Orthopedic Surgery

## 2011-06-21 ENCOUNTER — Other Ambulatory Visit (HOSPITAL_COMMUNITY): Payer: Self-pay | Admitting: Orthopedic Surgery

## 2011-06-21 DIAGNOSIS — M25532 Pain in left wrist: Secondary | ICD-10-CM

## 2011-06-24 ENCOUNTER — Other Ambulatory Visit (HOSPITAL_COMMUNITY): Payer: Self-pay

## 2011-06-27 ENCOUNTER — Ambulatory Visit (HOSPITAL_COMMUNITY)
Admission: RE | Admit: 2011-06-27 | Discharge: 2011-06-27 | Disposition: A | Discharge status: Home / Self Care | Payer: Self-pay | Source: Ambulatory Visit | Attending: Orthopedic Surgery | Admitting: Orthopedic Surgery

## 2011-06-27 DIAGNOSIS — M25532 Pain in left wrist: Secondary | ICD-10-CM

## 2011-06-27 DIAGNOSIS — S62009A Unspecified fracture of navicular [scaphoid] bone of unspecified wrist, initial encounter for closed fracture: Secondary | ICD-10-CM | POA: Insufficient documentation

## 2011-06-27 DIAGNOSIS — M25539 Pain in unspecified wrist: Secondary | ICD-10-CM | POA: Insufficient documentation

## 2011-06-27 DIAGNOSIS — X58XXXA Exposure to other specified factors, initial encounter: Secondary | ICD-10-CM | POA: Insufficient documentation

## 2013-03-11 ENCOUNTER — Emergency Department (HOSPITAL_COMMUNITY)
Admission: EM | Admit: 2013-03-11 | Discharge: 2013-03-11 | Disposition: A | Discharge status: Home / Self Care | Payer: BC Managed Care – PPO | Attending: Emergency Medicine | Admitting: Emergency Medicine

## 2013-03-11 ENCOUNTER — Encounter (HOSPITAL_COMMUNITY): Payer: Self-pay | Admitting: Emergency Medicine

## 2013-03-11 ENCOUNTER — Emergency Department (HOSPITAL_COMMUNITY): Payer: BC Managed Care – PPO

## 2013-03-11 DIAGNOSIS — F17200 Nicotine dependence, unspecified, uncomplicated: Secondary | ICD-10-CM | POA: Insufficient documentation

## 2013-03-11 DIAGNOSIS — R05 Cough: Secondary | ICD-10-CM

## 2013-03-11 DIAGNOSIS — R059 Cough, unspecified: Secondary | ICD-10-CM | POA: Insufficient documentation

## 2013-03-11 DIAGNOSIS — Z880 Allergy status to penicillin: Secondary | ICD-10-CM | POA: Insufficient documentation

## 2013-03-11 DIAGNOSIS — Z87828 Personal history of other (healed) physical injury and trauma: Secondary | ICD-10-CM | POA: Insufficient documentation

## 2013-03-11 MED ORDER — AZITHROMYCIN 250 MG PO TABS: 250.0000 mg | ORAL_TABLET | Freq: Every day | ORAL | Status: DC

## 2013-03-11 NOTE — ED Notes (Signed)
Patient c/o cough and shortness of breath x 2 weeks; states he is coughing so hard and often that it makes him vomit.
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
## 2013-03-20 ENCOUNTER — Encounter (HOSPITAL_COMMUNITY): Payer: Self-pay | Admitting: *Deleted

## 2013-03-20 ENCOUNTER — Emergency Department (HOSPITAL_COMMUNITY): Payer: BC Managed Care – PPO

## 2013-03-20 ENCOUNTER — Emergency Department (HOSPITAL_COMMUNITY)
Admission: EM | Admit: 2013-03-20 | Discharge: 2013-03-21 | Disposition: A | Discharge status: Home / Self Care | Payer: BC Managed Care – PPO | Attending: Emergency Medicine | Admitting: Emergency Medicine

## 2013-03-20 DIAGNOSIS — J209 Acute bronchitis, unspecified: Secondary | ICD-10-CM

## 2013-03-20 DIAGNOSIS — Z880 Allergy status to penicillin: Secondary | ICD-10-CM | POA: Insufficient documentation

## 2013-03-20 DIAGNOSIS — F17200 Nicotine dependence, unspecified, uncomplicated: Secondary | ICD-10-CM | POA: Insufficient documentation

## 2013-03-20 DIAGNOSIS — R0789 Other chest pain: Secondary | ICD-10-CM | POA: Insufficient documentation

## 2013-03-20 DIAGNOSIS — Z87828 Personal history of other (healed) physical injury and trauma: Secondary | ICD-10-CM | POA: Insufficient documentation

## 2013-03-20 DIAGNOSIS — Z79899 Other long term (current) drug therapy: Secondary | ICD-10-CM | POA: Insufficient documentation

## 2013-03-20 DIAGNOSIS — M549 Dorsalgia, unspecified: Secondary | ICD-10-CM | POA: Insufficient documentation

## 2013-03-20 DIAGNOSIS — R5381 Other malaise: Secondary | ICD-10-CM | POA: Insufficient documentation

## 2013-03-20 DIAGNOSIS — R61 Generalized hyperhidrosis: Secondary | ICD-10-CM | POA: Insufficient documentation

## 2013-03-20 DIAGNOSIS — R509 Fever, unspecified: Secondary | ICD-10-CM | POA: Insufficient documentation

## 2013-03-20 MED ORDER — ALBUTEROL SULFATE (5 MG/ML) 0.5% IN NEBU
2.5000 mg | INHALATION_SOLUTION | Freq: Once | RESPIRATORY_TRACT | Status: AC
  Administered 2013-03-20: 2.5 mg via RESPIRATORY_TRACT
  Filled 2013-03-20: qty 0.5

## 2013-03-20 MED ORDER — IPRATROPIUM BROMIDE 0.02 % IN SOLN
0.5000 mg | Freq: Once | RESPIRATORY_TRACT | Status: AC
  Administered 2013-03-20: 0.5 mg via RESPIRATORY_TRACT
  Filled 2013-03-20: qty 2.5

## 2013-03-20 NOTE — ED Notes (Signed)
Pt c/o productive cough, states this is second time for same thing and was seen last week for same, ;thinks he had a fever earlier today
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
## 2013-03-21 MED ORDER — HYDROCODONE-ACETAMINOPHEN 5-325 MG PO TABS
1.0000 | ORAL_TABLET | Freq: Once | ORAL | Status: AC
  Administered 2013-03-21: 1 via ORAL
  Filled 2013-03-21: qty 1

## 2013-03-21 MED ORDER — PREDNISONE 50 MG PO TABS
60.0000 mg | ORAL_TABLET | Freq: Once | ORAL | Status: AC
  Administered 2013-03-21: 01:00:00 60 mg via ORAL
  Filled 2013-03-21: qty 1

## 2013-03-21 MED ORDER — HYDROCOD POLST-CHLORPHEN POLST 10-8 MG/5ML PO LQCR: 5.0000 mL | Freq: Two times a day (BID) | ORAL | Status: DC | PRN

## 2013-03-21 MED ORDER — PREDNISONE 20 MG PO TABS: 60.0000 mg | ORAL_TABLET | Freq: Every day | ORAL | Status: DC

## 2018-04-02 ENCOUNTER — Encounter (HOSPITAL_COMMUNITY): Payer: Self-pay | Admitting: Emergency Medicine

## 2018-04-02 ENCOUNTER — Ambulatory Visit (HOSPITAL_COMMUNITY)
Admission: EM | Admit: 2018-04-02 | Discharge: 2018-04-02 | Disposition: A | Discharge status: Home / Self Care | Payer: 59 | Attending: Family Medicine | Admitting: Family Medicine

## 2018-04-02 ENCOUNTER — Other Ambulatory Visit: Payer: Self-pay

## 2018-04-02 DIAGNOSIS — J069 Acute upper respiratory infection, unspecified: Secondary | ICD-10-CM

## 2018-04-02 DIAGNOSIS — B9789 Other viral agents as the cause of diseases classified elsewhere: Secondary | ICD-10-CM | POA: Diagnosis not present

## 2018-04-02 MED ORDER — CETIRIZINE HCL 5 MG PO TABS: 10.0000 mg | ORAL_TABLET | Freq: Every day | ORAL | 1 refills | Status: DC

## 2018-04-02 MED ORDER — OMEPRAZOLE 20 MG PO CPDR
20.0000 mg | DELAYED_RELEASE_CAPSULE | Freq: Every day | ORAL | 1 refills | Status: DC
Start: 2018-04-02 — End: 2019-11-08

## 2018-04-02 MED ORDER — AZITHROMYCIN 250 MG PO TABS: 250.0000 mg | ORAL_TABLET | Freq: Every day | ORAL | 0 refills | Status: DC

## 2018-04-02 MED ORDER — PREDNISONE 10 MG (21) PO TBPK: ORAL_TABLET | ORAL | 0 refills | Status: DC

## 2018-04-02 NOTE — ED Triage Notes (Signed)
Cough for 6-7 weeks.  Cough is non-productive.  Patient has soreness in right rib cage.
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
## 2018-04-02 NOTE — Discharge Instructions (Addendum)
It was nice meeting you!!  Your cough could be related to allergies or acid reflux We will go ahead and prescribe some prednisone and antibiotics based on length of symptoms I would like for you to try the omeprazole for acid reflux and Zyrtec for allergies to see if this helps Follow-up with your primary care provider as needed

## 2018-04-29 ENCOUNTER — Emergency Department (HOSPITAL_COMMUNITY)
Admission: EM | Admit: 2018-04-29 | Discharge: 2018-04-29 | Disposition: A | Discharge status: Home / Self Care | Payer: 59 | Attending: Emergency Medicine | Admitting: Emergency Medicine

## 2018-04-29 ENCOUNTER — Encounter (HOSPITAL_COMMUNITY): Payer: Self-pay | Admitting: Emergency Medicine

## 2018-04-29 ENCOUNTER — Emergency Department (HOSPITAL_COMMUNITY): Payer: 59

## 2018-04-29 DIAGNOSIS — Z87891 Personal history of nicotine dependence: Secondary | ICD-10-CM | POA: Insufficient documentation

## 2018-04-29 DIAGNOSIS — R197 Diarrhea, unspecified: Secondary | ICD-10-CM | POA: Diagnosis not present

## 2018-04-29 DIAGNOSIS — R1084 Generalized abdominal pain: Secondary | ICD-10-CM

## 2018-04-29 DIAGNOSIS — R739 Hyperglycemia, unspecified: Secondary | ICD-10-CM

## 2018-04-29 DIAGNOSIS — R1031 Right lower quadrant pain: Secondary | ICD-10-CM | POA: Diagnosis present

## 2018-04-29 LAB — COMPREHENSIVE METABOLIC PANEL
ALBUMIN: 4.1 g/dL (ref 3.5–5.0)
ALK PHOS: 81 U/L (ref 38–126)
ALT: 22 U/L (ref 0–44)
AST: 21 U/L (ref 15–41)
Anion gap: 7 (ref 5–15)
BUN: 14 mg/dL (ref 6–20)
CHLORIDE: 105 mmol/L (ref 98–111)
CO2: 24 mmol/L (ref 22–32)
Calcium: 9 mg/dL (ref 8.9–10.3)
Creatinine, Ser: 0.91 mg/dL (ref 0.61–1.24)
GFR calc non Af Amer: >60 mL/min (ref >60)
GLUCOSE: 159 mg/dL — AB (ref 70–99)
Potassium: 3.5 mmol/L (ref 3.5–5.1)
Sodium: 136 mmol/L (ref 135–145)
Total Bilirubin: 0.7 mg/dL (ref 0.3–1.2)
Total Protein: 7.6 g/dL (ref 6.5–8.1)

## 2018-04-29 LAB — URINALYSIS, ROUTINE W REFLEX MICROSCOPIC
BACTERIA UA: NONE SEEN
Bilirubin Urine: NEGATIVE
Glucose, UA: >=500 mg/dL — AB
Hgb urine dipstick: NEGATIVE
KETONES UR: NEGATIVE mg/dL
LEUKOCYTES UA: NEGATIVE
Nitrite: NEGATIVE
PROTEIN: 30 mg/dL — AB
Specific Gravity, Urine: 1.031 — ABNORMAL HIGH (ref 1.005–1.030)
pH: 5 (ref 5.0–8.0)

## 2018-04-29 LAB — CBC
HEMATOCRIT: 46.1 % (ref 39.0–52.0)
Hemoglobin: 14.7 g/dL (ref 13.0–17.0)
MCH: 27.7 pg (ref 26.0–34.0)
MCHC: 31.9 g/dL (ref 30.0–36.0)
MCV: 86.8 fL (ref 80.0–100.0)
NRBC: 0 % (ref 0.0–0.2)
Platelets: 302 10*3/uL (ref 150–400)
RBC: 5.31 MIL/uL (ref 4.22–5.81)
RDW: 13.2 % (ref 11.5–15.5)
WBC: 8.9 10*3/uL (ref 4.0–10.5)

## 2018-04-29 LAB — LIPASE, BLOOD: Lipase: 29 U/L (ref 11–51)

## 2018-04-29 MED ORDER — MORPHINE SULFATE (PF) 4 MG/ML IV SOLN
4.0000 mg | Freq: Once | INTRAVENOUS | Status: AC
  Administered 2018-04-29: 4 mg via INTRAVENOUS
  Filled 2018-04-29: qty 1

## 2018-04-29 MED ORDER — DICYCLOMINE HCL 20 MG PO TABS: 20.0000 mg | ORAL_TABLET | Freq: Two times a day (BID) | ORAL | 0 refills | Status: DC

## 2018-04-29 MED ORDER — IOPAMIDOL (ISOVUE-300) INJECTION 61%
100.0000 mL | Freq: Once | INTRAVENOUS | Status: AC | PRN
  Administered 2018-04-29: 100 mL via INTRAVENOUS

## 2018-04-29 MED ORDER — SODIUM CHLORIDE 0.9 % IV BOLUS
1000.0000 mL | Freq: Once | INTRAVENOUS | Status: AC
  Administered 2018-04-29: 1000 mL via INTRAVENOUS

## 2018-04-29 MED ORDER — ONDANSETRON HCL 4 MG/2ML IJ SOLN
4.0000 mg | Freq: Once | INTRAMUSCULAR | Status: AC
  Administered 2018-04-29: 4 mg via INTRAVENOUS
  Filled 2018-04-29: qty 2
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
## 2018-04-29 NOTE — ED Triage Notes (Signed)
Patient reports R sided abdominal pain for the past 3-4 days. Patient states it has become gradually worse and radiates into his R flank.

## 2018-04-29 NOTE — Discharge Instructions (Addendum)
You were seen in the emergency department for some abdominal pain and diarrhea.  You had blood work and a CAT scan that did not show an obvious cause of your symptoms.  We are prescribing some medication to help with the cramps.  We also noted that your blood sugar was high here and this may be an early sign of diabetes.  Please contact your doctor for follow-up appointment.  Return if any fever or concerns.

## 2018-09-10 DIAGNOSIS — A599 Trichomoniasis, unspecified: Secondary | ICD-10-CM | POA: Diagnosis not present

## 2018-09-10 DIAGNOSIS — I10 Essential (primary) hypertension: Secondary | ICD-10-CM | POA: Diagnosis not present

## 2018-09-28 DIAGNOSIS — W57XXXA Bitten or stung by nonvenomous insect and other nonvenomous arthropods, initial encounter: Secondary | ICD-10-CM | POA: Diagnosis not present

## 2018-09-28 DIAGNOSIS — B349 Viral infection, unspecified: Secondary | ICD-10-CM | POA: Diagnosis not present

## 2019-01-15 ENCOUNTER — Encounter (HOSPITAL_COMMUNITY): Payer: Self-pay

## 2019-01-15 ENCOUNTER — Other Ambulatory Visit: Payer: Self-pay

## 2019-01-15 ENCOUNTER — Emergency Department (HOSPITAL_COMMUNITY)
Admission: EM | Admit: 2019-01-15 | Discharge: 2019-01-15 | Disposition: A | Discharge status: Home / Self Care | Payer: PRIVATE HEALTH INSURANCE | Attending: Emergency Medicine | Admitting: Emergency Medicine

## 2019-01-15 ENCOUNTER — Emergency Department (HOSPITAL_COMMUNITY): Payer: PRIVATE HEALTH INSURANCE

## 2019-01-15 DIAGNOSIS — I10 Essential (primary) hypertension: Secondary | ICD-10-CM | POA: Insufficient documentation

## 2019-01-15 DIAGNOSIS — Z79899 Other long term (current) drug therapy: Secondary | ICD-10-CM | POA: Diagnosis not present

## 2019-01-15 DIAGNOSIS — R1084 Generalized abdominal pain: Secondary | ICD-10-CM

## 2019-01-15 DIAGNOSIS — K5900 Constipation, unspecified: Secondary | ICD-10-CM | POA: Diagnosis not present

## 2019-01-15 DIAGNOSIS — Z87891 Personal history of nicotine dependence: Secondary | ICD-10-CM | POA: Diagnosis not present

## 2019-01-15 DIAGNOSIS — R1011 Right upper quadrant pain: Secondary | ICD-10-CM | POA: Diagnosis present

## 2019-01-15 HISTORY — DX: Gastro-esophageal reflux disease without esophagitis: K21.9

## 2019-01-15 LAB — CBC WITH DIFFERENTIAL/PLATELET
Abs Immature Granulocytes: 0.01 10*3/uL (ref 0.00–0.07)
Basophils Absolute: 0.1 10*3/uL (ref 0.0–0.1)
Basophils Relative: 1 %
Eosinophils Absolute: 0.3 10*3/uL (ref 0.0–0.5)
Eosinophils Relative: 3 %
HCT: 44.9 % (ref 39.0–52.0)
Hemoglobin: 14.8 g/dL (ref 13.0–17.0)
Immature Granulocytes: 0 %
Lymphocytes Relative: 25 %
Lymphs Abs: 2.2 10*3/uL (ref 0.7–4.0)
MCH: 29 pg (ref 26.0–34.0)
MCHC: 33 g/dL (ref 30.0–36.0)
MCV: 87.9 fL (ref 80.0–100.0)
Monocytes Absolute: 0.7 10*3/uL (ref 0.1–1.0)
Monocytes Relative: 8 %
Neutro Abs: 5.6 10*3/uL (ref 1.7–7.7)
Neutrophils Relative %: 63 %
Platelets: 332 10*3/uL (ref 150–400)
RBC: 5.11 MIL/uL (ref 4.22–5.81)
RDW: 12.8 % (ref 11.5–15.5)
WBC: 8.9 10*3/uL (ref 4.0–10.5)
nRBC: 0 % (ref 0.0–0.2)

## 2019-01-15 LAB — COMPREHENSIVE METABOLIC PANEL
ALT: 20 U/L (ref 0–44)
AST: 20 U/L (ref 15–41)
Albumin: 4.1 g/dL (ref 3.5–5.0)
Alkaline Phosphatase: 72 U/L (ref 38–126)
Anion gap: 7 (ref 5–15)
BUN: 13 mg/dL (ref 6–20)
CO2: 23 mmol/L (ref 22–32)
Calcium: 8.6 mg/dL — ABNORMAL LOW (ref 8.9–10.3)
Chloride: 108 mmol/L (ref 98–111)
Creatinine, Ser: 0.91 mg/dL (ref 0.61–1.24)
GFR calc Af Amer: >60 mL/min (ref >60)
GFR calc non Af Amer: >60 mL/min (ref >60)
Glucose, Bld: 88 mg/dL (ref 70–99)
Potassium: 4 mmol/L (ref 3.5–5.1)
Sodium: 138 mmol/L (ref 135–145)
Total Bilirubin: 0.4 mg/dL (ref 0.3–1.2)
Total Protein: 7.5 g/dL (ref 6.5–8.1)

## 2019-01-15 LAB — URINALYSIS, ROUTINE W REFLEX MICROSCOPIC
Bilirubin Urine: NEGATIVE
Glucose, UA: NEGATIVE mg/dL
Hgb urine dipstick: NEGATIVE
Ketones, ur: NEGATIVE mg/dL
Leukocytes,Ua: NEGATIVE
Nitrite: NEGATIVE
Protein, ur: NEGATIVE mg/dL
Specific Gravity, Urine: 1.015 (ref 1.005–1.030)
pH: 7 (ref 5.0–8.0)

## 2019-01-15 LAB — LIPASE, BLOOD: Lipase: 34 U/L (ref 11–51)

## 2019-01-15 MED ORDER — AMLODIPINE BESYLATE 10 MG PO TABS: 10.0000 mg | ORAL_TABLET | Freq: Every day | ORAL | 0 refills | Status: DC

## 2019-01-15 MED ORDER — SODIUM CHLORIDE 0.9 % IV BOLUS
1000.0000 mL | Freq: Once | INTRAVENOUS | Status: AC
  Administered 2019-01-15: 02:00:00 1000 mL via INTRAVENOUS

## 2019-01-15 MED ORDER — SODIUM CHLORIDE 0.9 % IV BOLUS
500.0000 mL | Freq: Once | INTRAVENOUS | Status: AC
  Administered 2019-01-15: 500 mL via INTRAVENOUS

## 2019-01-15 MED ORDER — FENTANYL CITRATE (PF) 100 MCG/2ML IJ SOLN
50.0000 ug | Freq: Once | INTRAMUSCULAR | Status: AC
  Administered 2019-01-15: 50 ug via INTRAVENOUS
  Filled 2019-01-15: qty 2

## 2019-01-15 MED ORDER — IOHEXOL 300 MG/ML  SOLN
100.0000 mL | Freq: Once | INTRAMUSCULAR | Status: AC | PRN
  Administered 2019-01-15: 100 mL via INTRAVENOUS

## 2019-01-15 MED ORDER — ONDANSETRON HCL 4 MG/2ML IJ SOLN
4.0000 mg | Freq: Once | INTRAMUSCULAR | Status: AC
  Administered 2019-01-15: 02:00:00 4 mg via INTRAVENOUS
  Filled 2019-01-15: qty 2

## 2019-01-15 NOTE — Discharge Instructions (Addendum)
You need to take your blood pressure medication every day.  Please follow-up with Dr. Ria Comment office to continue on blood pressure medication.  Your CT scan shows you have a lot of stool throughout your colon.  Otherwise it is a normal study. Get miralax and put one dose or 17 g in 8 ounces of water,  take 1 dose every 30 minutes for 2-3 hours or until you  get good results and then once or twice daily to prevent constipation.
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
## 2019-01-15 NOTE — ED Notes (Signed)
Patient transported to CT 

## 2019-01-15 NOTE — ED Triage Notes (Signed)
Pt reports right sided abdominal pain (RLQ and RUQ) that started about a week ago, over the past 3 days pain occurs after pt drinks any liquid. Pt had hard stool last week and took laxative, has had softer stools since then. Pt denies urinary symptoms.

## 2019-02-27 IMAGING — CT CT ABD-PELV W/ CM
2 of 4 series · 16 of 46 positions shown, 18 images · IV contrast (iopamidol)
Comparison: CT 05/25/2012

CLINICAL DATA: Right-sided abdominal pain

EXAM:
CT ABDOMEN AND PELVIS WITH CONTRAST
TECHNIQUE: Multidetector CT imaging of the abdomen and pelvis was performed
using the standard protocol following bolus administration of
intravenous contrast.
CONTRAST:  100mL BVWGB0-MJJ IOPAMIDOL (BVWGB0-MJJ) INJECTION 61%

[Series 2: axial st · axial · 0.68mm/px · z∈[-490,-36]mm · 13 of 99 slices shown, 15 images]
[im 4/99  soft-tissue]
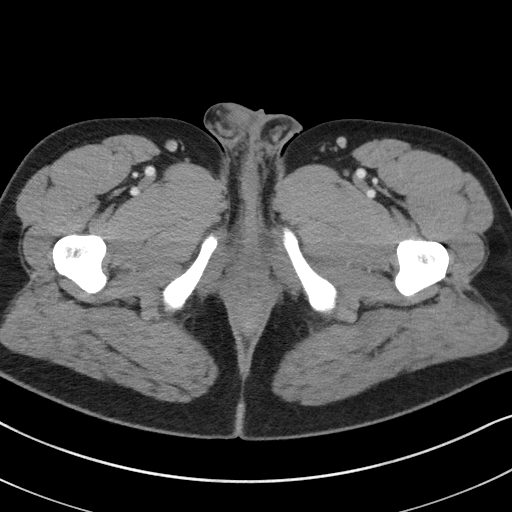
[im 4/99  bone]
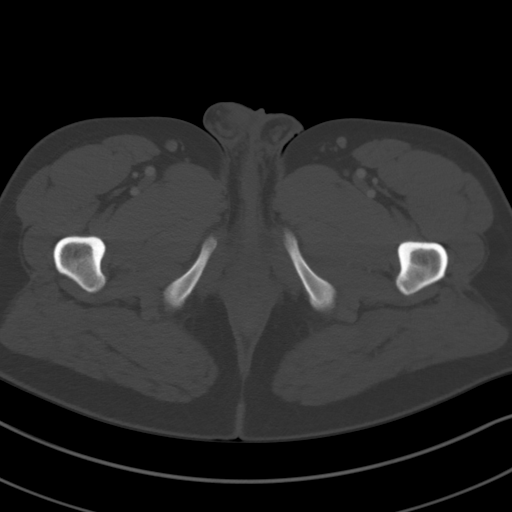
[im 12/99  soft-tissue]
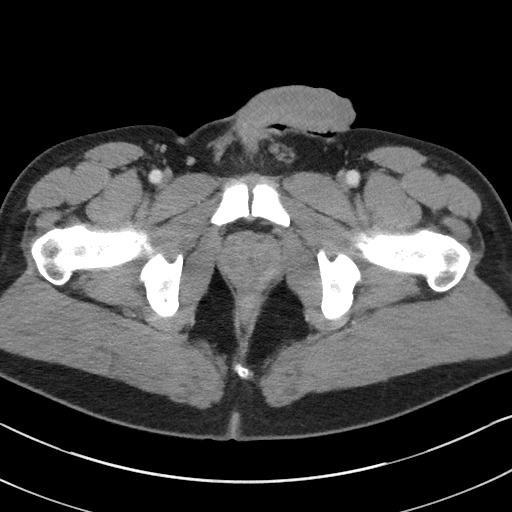
[im 19/99  soft-tissue]
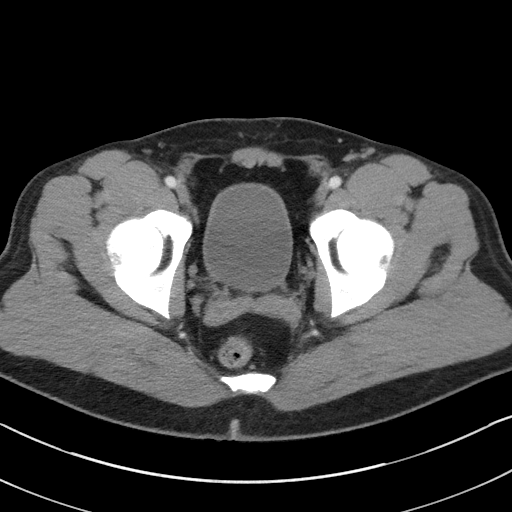
[im 27/99  soft-tissue]
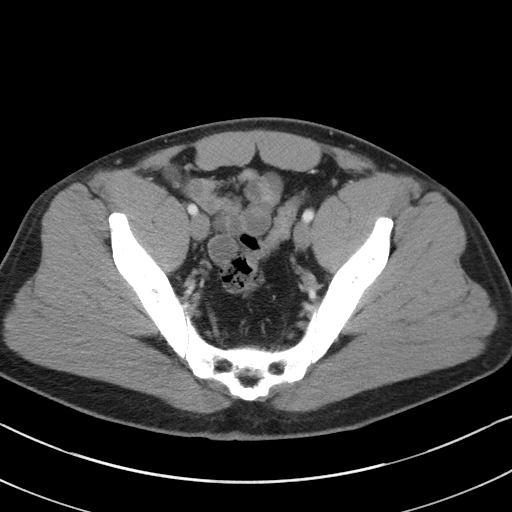
[im 34/99  soft-tissue]
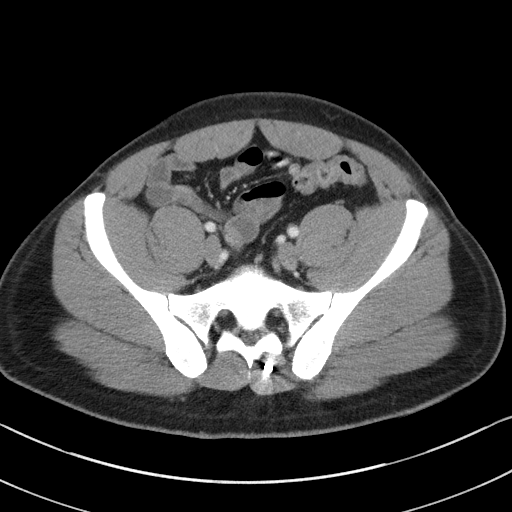
[im 42/99  soft-tissue]
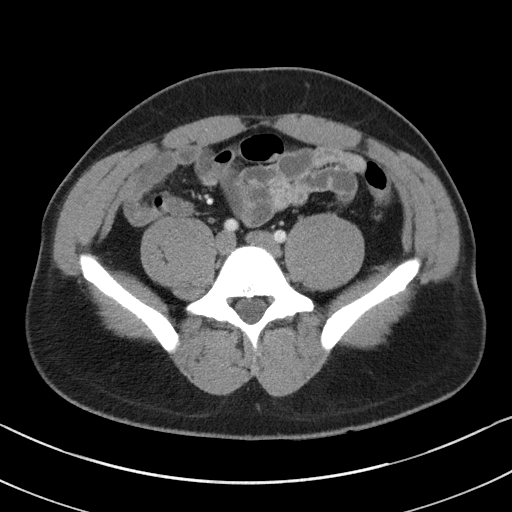
[im 50/99  soft-tissue]
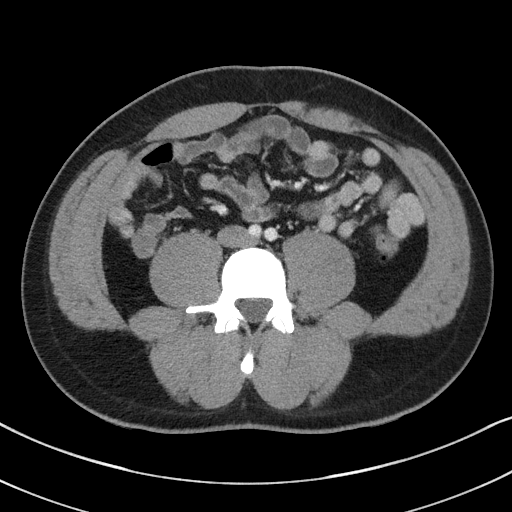
[im 57/99  soft-tissue]
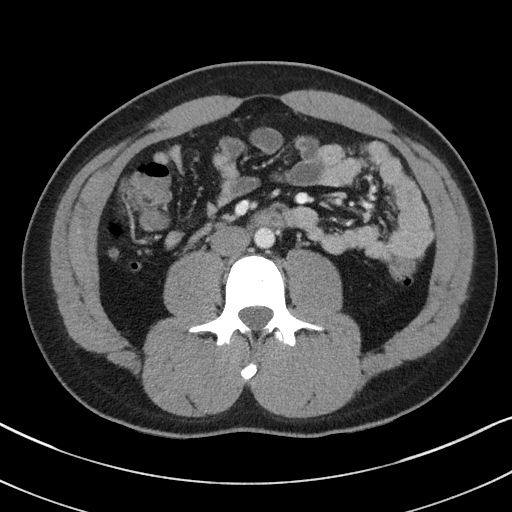
[im 65/99  soft-tissue]
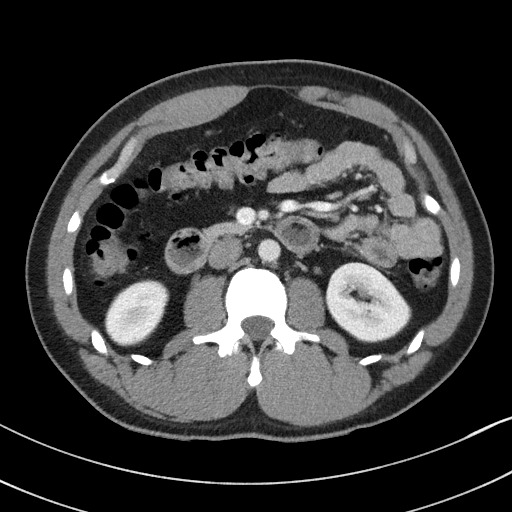
[im 65/99  bone]
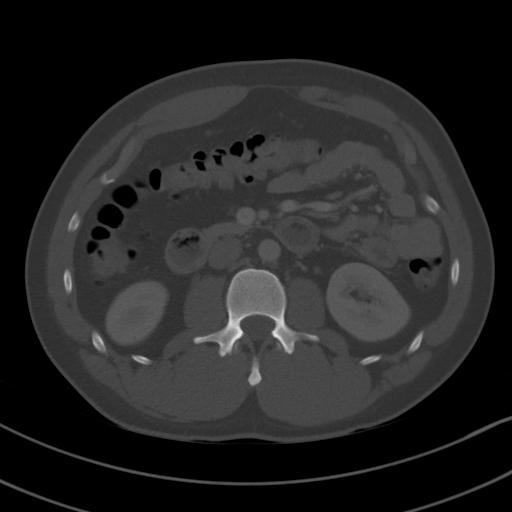
[im 72/99  soft-tissue]
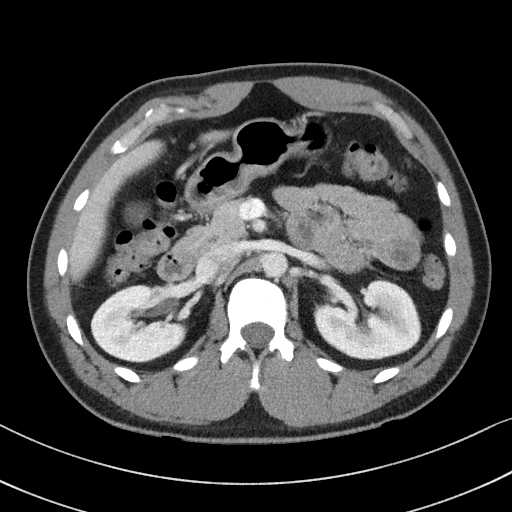
[im 80/99  soft-tissue]
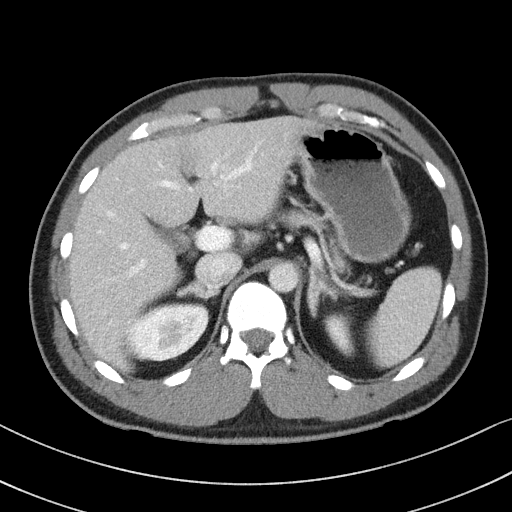
[im 87/99  soft-tissue]
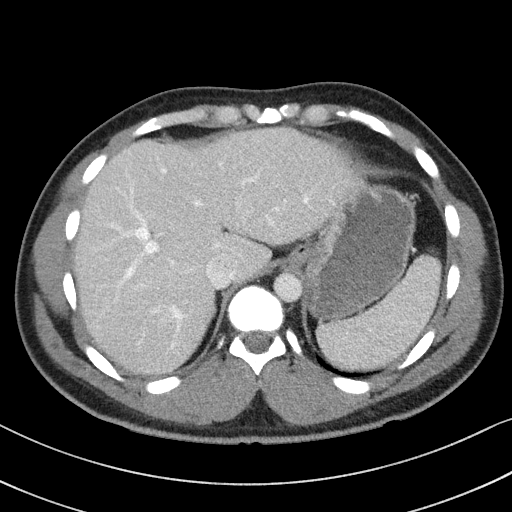
[im 95/99  soft-tissue]
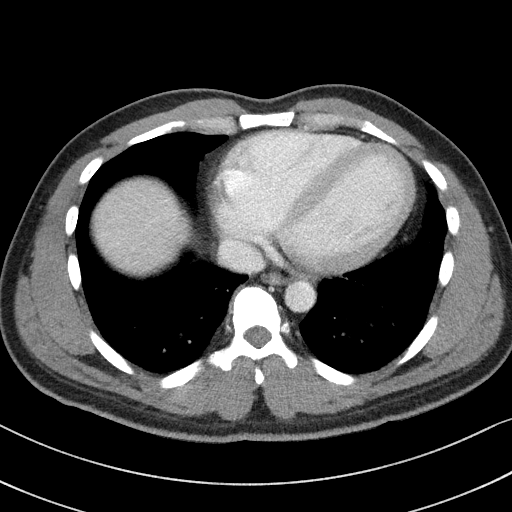

[Series 5: coronal st · coronal · 0.67mm/px · 3 of 85 slices shown]
[im 29/85  soft-tissue]
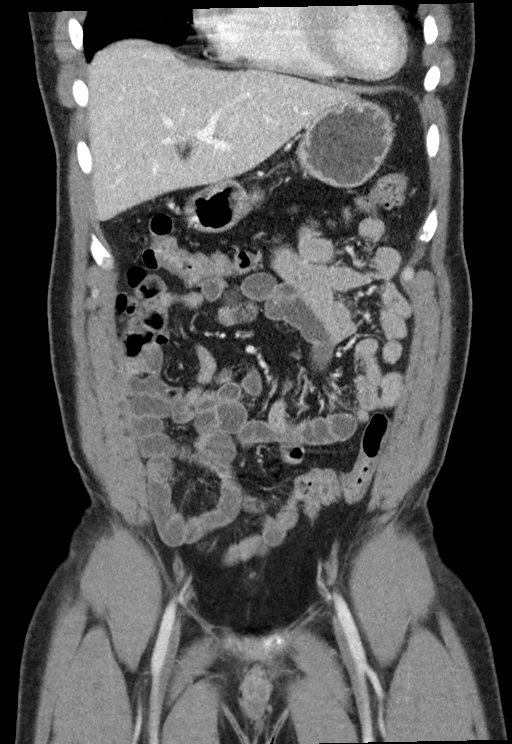
[im 38/85  soft-tissue]
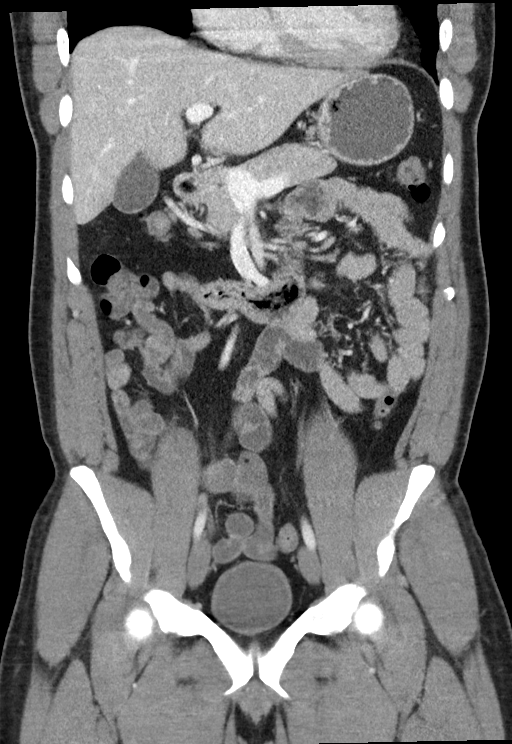
[im 47/85  soft-tissue]
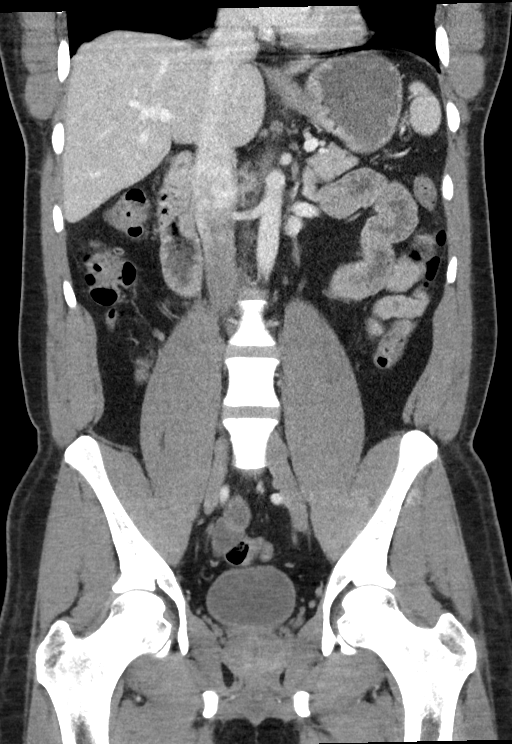

[16 of 46 positions shown; findings below may reference images not displayed]

FINDINGS: Lower chest: Lung bases demonstrate no acute consolidation or
effusion. The heart size is normal.

Hepatobiliary: No focal liver abnormality is seen. No gallstones,
gallbladder wall thickening, or biliary dilatation.

Pancreas: Unremarkable. No pancreatic ductal dilatation or
surrounding inflammatory changes.

Spleen: Normal in size without focal abnormality.

Adrenals/Urinary Tract: Adrenal glands are unremarkable. Kidneys are
normal, without renal calculi, focal lesion, or hydronephrosis.
Bladder is unremarkable.

Stomach/Bowel: Stomach is within normal limits. Appendix appears
normal. No evidence of bowel wall thickening, distention, or
inflammatory changes.

Vascular/Lymphatic: No significant vascular findings are present. No
enlarged abdominal or pelvic lymph nodes.

Reproductive: Prostate is unremarkable.

Other: No abdominal wall hernia or abnormality. No abdominopelvic
ascites.

Musculoskeletal: No acute or significant osseous findings.
IMPRESSION: Negative. No CT evidence for acute intra-abdominal or pelvic
abnormality.

## 2019-11-08 ENCOUNTER — Emergency Department (HOSPITAL_COMMUNITY)
Admission: EM | Admit: 2019-11-08 | Discharge: 2019-11-08 | Disposition: A | Discharge status: Home / Self Care | Payer: BC Managed Care – PPO | Attending: Emergency Medicine | Admitting: Emergency Medicine

## 2019-11-08 ENCOUNTER — Emergency Department (HOSPITAL_COMMUNITY): Payer: BC Managed Care – PPO

## 2019-11-08 ENCOUNTER — Encounter (HOSPITAL_COMMUNITY): Payer: Self-pay | Admitting: Emergency Medicine

## 2019-11-08 ENCOUNTER — Other Ambulatory Visit: Payer: Self-pay

## 2019-11-08 DIAGNOSIS — Z87891 Personal history of nicotine dependence: Secondary | ICD-10-CM | POA: Diagnosis not present

## 2019-11-08 DIAGNOSIS — M545 Low back pain, unspecified: Secondary | ICD-10-CM

## 2019-11-08 DIAGNOSIS — R109 Unspecified abdominal pain: Secondary | ICD-10-CM | POA: Diagnosis present

## 2019-11-08 LAB — CBC WITH DIFFERENTIAL/PLATELET
Abs Immature Granulocytes: 0.03 10*3/uL (ref 0.00–0.07)
Basophils Absolute: 0.1 10*3/uL (ref 0.0–0.1)
Basophils Relative: 1 %
Eosinophils Absolute: 0.2 10*3/uL (ref 0.0–0.5)
Eosinophils Relative: 2 %
HCT: 45.6 % (ref 39.0–52.0)
Hemoglobin: 15 g/dL (ref 13.0–17.0)
Immature Granulocytes: 0 %
Lymphocytes Relative: 22 %
Lymphs Abs: 2.1 10*3/uL (ref 0.7–4.0)
MCH: 28.8 pg (ref 26.0–34.0)
MCHC: 32.9 g/dL (ref 30.0–36.0)
MCV: 87.7 fL (ref 80.0–100.0)
Monocytes Absolute: 0.9 10*3/uL (ref 0.1–1.0)
Monocytes Relative: 9 %
Neutro Abs: 6.5 10*3/uL (ref 1.7–7.7)
Neutrophils Relative %: 66 %
Platelets: 314 10*3/uL (ref 150–400)
RBC: 5.2 MIL/uL (ref 4.22–5.81)
RDW: 12.8 % (ref 11.5–15.5)
WBC: 9.8 10*3/uL (ref 4.0–10.5)
nRBC: 0 % (ref 0.0–0.2)

## 2019-11-08 LAB — COMPREHENSIVE METABOLIC PANEL
ALT: 20 U/L (ref 0–44)
AST: 18 U/L (ref 15–41)
Albumin: 4.1 g/dL (ref 3.5–5.0)
Alkaline Phosphatase: 71 U/L (ref 38–126)
Anion gap: 8 (ref 5–15)
BUN: 16 mg/dL (ref 6–20)
CO2: 28 mmol/L (ref 22–32)
Calcium: 8.9 mg/dL (ref 8.9–10.3)
Chloride: 103 mmol/L (ref 98–111)
Creatinine, Ser: 1.06 mg/dL (ref 0.61–1.24)
GFR calc Af Amer: >60 mL/min (ref >60)
GFR calc non Af Amer: >60 mL/min (ref >60)
Glucose, Bld: 97 mg/dL (ref 70–99)
Potassium: 3.8 mmol/L (ref 3.5–5.1)
Sodium: 139 mmol/L (ref 135–145)
Total Bilirubin: 0.5 mg/dL (ref 0.3–1.2)
Total Protein: 7.4 g/dL (ref 6.5–8.1)

## 2019-11-08 LAB — URINALYSIS, ROUTINE W REFLEX MICROSCOPIC
Bilirubin Urine: NEGATIVE
Glucose, UA: NEGATIVE mg/dL
Hgb urine dipstick: NEGATIVE
Ketones, ur: NEGATIVE mg/dL
Leukocytes,Ua: NEGATIVE
Nitrite: NEGATIVE
Protein, ur: NEGATIVE mg/dL
Specific Gravity, Urine: 1.032 — ABNORMAL HIGH (ref 1.005–1.030)
pH: 5 (ref 5.0–8.0)

## 2019-11-08 LAB — LIPASE, BLOOD: Lipase: 26 U/L (ref 11–51)

## 2019-11-08 MED ORDER — MELOXICAM 15 MG PO TABS: 15.0000 mg | ORAL_TABLET | Freq: Every day | ORAL | 0 refills | Status: DC

## 2019-11-08 MED ORDER — IOHEXOL 300 MG/ML  SOLN
100.0000 mL | Freq: Once | INTRAMUSCULAR | Status: AC | PRN
  Administered 2019-11-08: 22:00:00 100 mL via INTRAVENOUS

## 2019-11-08 MED ORDER — KETOROLAC TROMETHAMINE 30 MG/ML IJ SOLN
30.0000 mg | Freq: Once | INTRAMUSCULAR | Status: AC
  Administered 2019-11-08: 30 mg via INTRAVENOUS
  Filled 2019-11-08: qty 1

## 2019-11-08 MED ORDER — ACETAMINOPHEN 500 MG PO TABS
500.0000 mg | ORAL_TABLET | Freq: Four times a day (QID) | ORAL | 0 refills | Status: DC | PRN
Start: 2019-11-08 — End: 2020-01-16

## 2019-11-08 NOTE — ED Triage Notes (Signed)
Pt c/o right flank pain x 2 days and pt states it has been hard to urinate.

## 2019-11-08 NOTE — Discharge Instructions (Signed)
1. Medications: Take meloxicam once daily with meals and with Pepcid.  Do not take ibuprofen, Advil, Aleve, or Motrin while taking this medication.  You can also take [phone] mg of Tylenol every 6 hours as needed for pain. Do not exceed 4000 mg of Tylenol daily.   2. Treatment: rest, drink plenty of fluids, gentle stretching as discussed (you can YouTube search low back physical therapy exercises), alternate ice and heat (or stick with whichever feels best) 20 minutes on 20 minutes off. 3. Follow Up: Please followup with your primary doctor in 3-7 days for discussion of your diagnoses and further evaluation after today's visit; if you do not have a primary care doctor use the resource guide provided to find one;  Return to the ER for worsening back pain, difficulty walking, loss of bowel or bladder control or other concerning symptoms
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
## 2020-01-16 ENCOUNTER — Encounter: Payer: Self-pay | Admitting: Emergency Medicine

## 2020-01-16 ENCOUNTER — Other Ambulatory Visit: Payer: Self-pay

## 2020-01-16 ENCOUNTER — Ambulatory Visit
Admission: EM | Admit: 2020-01-16 | Discharge: 2020-01-16 | Disposition: A | Discharge status: Home / Self Care | Payer: BC Managed Care – PPO | Attending: Emergency Medicine | Admitting: Emergency Medicine

## 2020-01-16 DIAGNOSIS — R350 Frequency of micturition: Secondary | ICD-10-CM | POA: Insufficient documentation

## 2020-01-16 DIAGNOSIS — M549 Dorsalgia, unspecified: Secondary | ICD-10-CM

## 2020-01-16 LAB — POCT URINALYSIS DIP (MANUAL ENTRY)
Bilirubin, UA: NEGATIVE
Blood, UA: NEGATIVE
Glucose, UA: NEGATIVE mg/dL
Ketones, POC UA: NEGATIVE mg/dL
Leukocytes, UA: NEGATIVE
Nitrite, UA: NEGATIVE
Spec Grav, UA: >=1.03 — AB (ref 1.010–1.025)
Urobilinogen, UA: 1 E.U./dL
pH, UA: 6 (ref 5.0–8.0)

## 2020-01-16 MED ORDER — MELOXICAM 15 MG PO TABS
15.0000 mg | ORAL_TABLET | Freq: Every day | ORAL | 0 refills | Status: DC
Start: 2020-01-16 — End: 2020-02-16

## 2020-01-16 NOTE — ED Triage Notes (Signed)
Mid back pain for 2 days.  Having urgency. Thinks he could have kidney infection.  Has been taking AZO.
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
## 2020-01-16 NOTE — Discharge Instructions (Addendum)
Declines STD testing Urine without sign of infectino Continue conservative management of rest, ice, and gentle stretches Take mobic as needed for pain relief (may cause abdominal discomfort, ulcers, and GI bleeds avoid taking with other NSAIDs) Follow up with PCP if symptoms persist Return or go to the ER if you have any new or worsening symptoms (fever, chills, chest pain, abdominal pain, changes in bowel or bladder habits, pain radiating into lower legs, etc...)

## 2020-01-17 LAB — URINE CULTURE: Culture: NO GROWTH

## 2020-02-16 ENCOUNTER — Other Ambulatory Visit: Payer: Self-pay

## 2020-02-16 ENCOUNTER — Ambulatory Visit
Admission: EM | Admit: 2020-02-16 | Discharge: 2020-02-16 | Disposition: A | Discharge status: Home / Self Care | Payer: BC Managed Care – PPO | Attending: Emergency Medicine | Admitting: Emergency Medicine

## 2020-02-16 ENCOUNTER — Encounter: Payer: Self-pay | Admitting: Emergency Medicine

## 2020-02-16 DIAGNOSIS — R059 Cough, unspecified: Secondary | ICD-10-CM

## 2020-02-16 DIAGNOSIS — R05 Cough: Secondary | ICD-10-CM

## 2020-02-16 DIAGNOSIS — Z20822 Contact with and (suspected) exposure to covid-19: Secondary | ICD-10-CM

## 2020-02-16 DIAGNOSIS — J069 Acute upper respiratory infection, unspecified: Secondary | ICD-10-CM

## 2020-02-16 MED ORDER — BENZONATATE 100 MG PO CAPS: 100.0000 mg | ORAL_CAPSULE | Freq: Three times a day (TID) | ORAL | 0 refills | Status: AC

## 2020-02-16 MED ORDER — BENZONATATE 100 MG PO CAPS
100.0000 mg | ORAL_CAPSULE | Freq: Three times a day (TID) | ORAL | 0 refills | Status: DC
Start: 2020-02-16 — End: 2020-02-16

## 2020-02-16 NOTE — ED Triage Notes (Signed)
Cough, headache, chills, fever and shortness of breath

## 2020-02-16 NOTE — Discharge Instructions (Signed)
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
## 2020-02-17 LAB — NOVEL CORONAVIRUS, NAA: SARS-CoV-2, NAA: DETECTED — AB

## 2020-02-17 LAB — SARS-COV-2, NAA 2 DAY TAT

## 2020-10-29 ENCOUNTER — Encounter: Payer: Self-pay | Admitting: Emergency Medicine

## 2020-10-29 ENCOUNTER — Ambulatory Visit
Admission: EM | Admit: 2020-10-29 | Discharge: 2020-10-29 | Disposition: A | Discharge status: Home / Self Care | Payer: BC Managed Care – PPO | Attending: Physician Assistant | Admitting: Physician Assistant

## 2020-10-29 DIAGNOSIS — I10 Essential (primary) hypertension: Secondary | ICD-10-CM | POA: Diagnosis present

## 2020-10-29 DIAGNOSIS — J029 Acute pharyngitis, unspecified: Secondary | ICD-10-CM | POA: Diagnosis not present

## 2020-10-29 HISTORY — DX: Essential (primary) hypertension: I10

## 2020-10-29 LAB — POCT RAPID STREP A (OFFICE): Rapid Strep A Screen: NEGATIVE

## 2020-10-29 NOTE — ED Provider Notes (Signed)
[facility]    CSN: [id number] Arrival date & time: 10/29/20  1111      History   Chief Complaint Chief Complaint  Patient presents with  . Sore Throat    HPI Jonathan Freeman is a 40 y.o. male.   Presents with a sore throat since last night. No fever or chills. No cough or congestion. No known exposures. He also carries a vague history of HTN and would like to be put on medications. He was never seen here and does not remember when he last had labs. No headaches, chest pain or dypsnea.      Past Medical History:  Diagnosis Date  . Hypertension     There are no problems to display for this patient.   Past Surgical History:  Procedure Laterality Date  . LEG SURGERY Right        Home Medications    Prior to Admission medications   Not on File    Family History History reviewed. No pertinent family history.  Social History Social History   Tobacco Use  . Smoking status: Never Smoker  . Smokeless tobacco: Never Used  Substance Use Topics  . Alcohol use: Yes    Comment: occ  . Drug use: Never     Allergies   Penicillins   Review of Systems Review of Systems  Constitutional: Negative for fatigue and fever.  HENT: Positive for sore throat. Negative for ear discharge, ear pain, rhinorrhea and sinus pain.   Eyes: Negative.   Respiratory: Negative for cough and shortness of breath.   Skin: Negative.      Physical Exam Triage Vital Signs ED Triage Vitals  Enc Vitals Group     BP 10/29/20 1127 (!) 162/105     Pulse Rate 10/29/20 1127 82     Resp 10/29/20 1127 19     Temp 10/29/20 1127 98.5 F (36.9 C)     Temp Source 10/29/20 1127 Oral     SpO2 10/29/20 1127 96 %     Weight 10/29/20 1126 192 lb (87.1 kg)     Height 10/29/20 1126 6\' 1"  (1.854 m)     Head Circumference --      Peak Flow --      Pain Score 10/29/20 1125 7     Pain Loc --      Pain Edu? --      Excl. in GC? --    No data found.  Updated Vital Signs BP (!)  162/105 (BP Location: Right Arm) Comment: has not had any bp meds since last year  Pulse 82   Temp 98.5 F (36.9 C) (Oral)   Resp 19   Ht 6\' 1"  (1.854 m)   Wt 192 lb (87.1 kg)   SpO2 96%   BMI 25.33 kg/m   Visual Acuity Right Eye Distance:   Left Eye Distance:   Bilateral Distance:    Right Eye Near:   Left Eye Near:    Bilateral Near:     Physical Exam Vitals and nursing note reviewed.  Constitutional:      General: He is not in acute distress.    Appearance: He is well-developed. He is not ill-appearing.  HENT:     Head: Normocephalic and atraumatic.     Mouth/Throat:     Mouth: Mucous membranes are moist. No oral lesions.     Pharynx: Posterior oropharyngeal erythema present. No pharyngeal swelling or oropharyngeal exudate.     Tonsils: Tonsillar abscess present.  No tonsillar exudate.  Cardiovascular:     Rate and Rhythm: Normal rate and regular rhythm.  Pulmonary:     Effort: Pulmonary effort is normal.     Breath sounds: Normal breath sounds.  Skin:    General: Skin is warm and dry.  Neurological:     General: No focal deficit present.     Mental Status: He is alert.      UC Treatments / Results  Labs (all labs ordered are listed, but only abnormal results are displayed) Labs Reviewed  CULTURE, GROUP A STREP [facility])  POCT RAPID STREP A (OFFICE)    EKG   Radiology No results found.  Procedures Procedures (including critical care time)  Medications Ordered in UC Medications - No data to display  Initial Impression / Assessment and Plan / UC Course  I have reviewed the triage vital signs and the nursing notes.  Pertinent labs & imaging results that were available during my care of the patient were reviewed by me and considered in my medical decision making (see chart for details).     No evidence of strep. Rapid negative.  Final Clinical Impressions(s) / UC Diagnoses   Final diagnoses:  None   Discharge Instructions   None    ED  Prescriptions    None     PDMP not reviewed this encounter.   Riki Sheer, PA-C 10/29/20 1149

## 2020-10-29 NOTE — Discharge Instructions (Signed)
No evidence of strep, but will culture for completeness. Please try and find a PCP to address your BP soon. Stay away for high salts.

## 2020-10-29 NOTE — ED Triage Notes (Addendum)
Sore throat and swollen tonsils since yesterday.  Also request rx for bp meds,.  has not taken any bp meds in over a year because unable to find pcp.

## 2020-11-01 LAB — CULTURE, GROUP A STREP (THRC)

## 2020-12-15 ENCOUNTER — Encounter: Payer: Self-pay | Admitting: Emergency Medicine

## 2023-01-14 ENCOUNTER — Encounter: Payer: Self-pay | Admitting: *Deleted

## 2023-01-15 ENCOUNTER — Encounter: Payer: BC Managed Care – PPO | Admitting: Internal Medicine

## 2023-01-15 NOTE — Progress Notes (Signed)
Erroneous encounter - please disregard.

## 2023-03-07 ENCOUNTER — Ambulatory Visit: Payer: BC Managed Care – PPO | Admitting: Internal Medicine

## 2023-03-07 NOTE — Progress Notes (Signed)
Erroneous encounter - please disregard.
